# Patient Record
Sex: Female | Born: 2000 | Race: Black or African American | Hispanic: Yes | Marital: Single | Smoking: Never smoker
Health system: Southern US, Community
[De-identification: ages and names within clinical notes are randomized; demographics above are authoritative.]

## PROBLEM LIST (undated history)

## (undated) DIAGNOSIS — I341 Nonrheumatic mitral (valve) prolapse: Secondary | ICD-10-CM

## (undated) DIAGNOSIS — L309 Dermatitis, unspecified: Secondary | ICD-10-CM

## (undated) DIAGNOSIS — F79 Unspecified intellectual disabilities: Secondary | ICD-10-CM

## (undated) DIAGNOSIS — R634 Abnormal weight loss: Secondary | ICD-10-CM

## (undated) DIAGNOSIS — J45909 Unspecified asthma, uncomplicated: Secondary | ICD-10-CM

## (undated) DIAGNOSIS — N946 Dysmenorrhea, unspecified: Secondary | ICD-10-CM

## (undated) DIAGNOSIS — J069 Acute upper respiratory infection, unspecified: Secondary | ICD-10-CM

## (undated) HISTORY — DX: Unspecified asthma, uncomplicated: J45.909

## (undated) HISTORY — DX: Dysmenorrhea, unspecified: N94.6

## (undated) HISTORY — PX: TOENAIL EXCISION: SHX183

## (undated) HISTORY — PX: MOUTH SURGERY: SHX715

## (undated) HISTORY — DX: Abnormal weight loss: R63.4

## (undated) HISTORY — DX: Dermatitis, unspecified: L30.9

## (undated) HISTORY — DX: Acute upper respiratory infection, unspecified: J06.9

## (undated) HISTORY — PX: TEAR DUCT PROBING: SHX793

---
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
## 2020-02-09 ENCOUNTER — Ambulatory Visit: Payer: Medicaid Other | Attending: Internal Medicine

## 2020-02-09 DIAGNOSIS — Z23 Encounter for immunization: Secondary | ICD-10-CM

## 2020-02-09 NOTE — Progress Notes (Signed)
   Covid-19 Vaccination Clinic  Name:  Lauren Cain    MRN: [id number] DOB: [birth year]  02/09/2020  [name] was observed post Covid-19 immunization for 15 minutes without incident. She was provided with Vaccine Information Sheet and instruction to access the V-Safe system.   [name] was instructed to call 911 with any severe reactions post vaccine: Marland Kitchen Difficulty breathing  . Swelling of face and throat  . A fast heartbeat  . A bad rash all over body  . Dizziness and weakness   Immunizations Administered    Name Date Dose VIS Date Route   Pfizer COVID-19 Vaccine 02/09/2020  1:44 PM 0.3 mL 12/03/2019 Intramuscular   Manufacturer: [employer], [employer]   Lot: [id number]   NDC: [address]-4

## 2020-02-11 ENCOUNTER — Other Ambulatory Visit: Payer: Self-pay

## 2020-02-11 ENCOUNTER — Ambulatory Visit: Payer: Medicaid Other | Admitting: Allergy

## 2020-02-11 ENCOUNTER — Ambulatory Visit
Admission: EM | Admit: 2020-02-11 | Discharge: 2020-02-11 | Disposition: A | Discharge status: Home / Self Care | Payer: Medicaid Other | Attending: Internal Medicine | Admitting: Internal Medicine

## 2020-02-11 DIAGNOSIS — R6889 Other general symptoms and signs: Secondary | ICD-10-CM | POA: Diagnosis not present

## 2020-02-11 DIAGNOSIS — M940 Chondrocostal junction syndrome [Tietze]: Secondary | ICD-10-CM

## 2020-02-11 DIAGNOSIS — Z1152 Encounter for screening for COVID-19: Secondary | ICD-10-CM

## 2020-02-11 MED ORDER — OSELTAMIVIR PHOSPHATE 75 MG PO CAPS: 75.0000 mg | ORAL_CAPSULE | Freq: Two times a day (BID) | ORAL | 0 refills | Status: DC
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
## 2020-02-11 NOTE — Discharge Instructions (Addendum)
Due to your chronic conditions and if your covid test is positive, you may qualify to receive Monoclonal Antibody infusion and if you don't hear from anyone within 24 hours of finding your results, call [phone]  I am placing you on the Tamiflu to cover influenza since will take 2-3 days for the test to come back and is best to start within 48h of symptoms

## 2020-02-11 NOTE — ED Triage Notes (Addendum)
Pt is here with chills, congestion with a cough that started yesterday, pt has taken OTC meds to relieve discomfort.

## 2020-02-12 LAB — COVID-19, FLU A+B NAA
Influenza A, NAA: NOT DETECTED
Influenza B, NAA: NOT DETECTED
SARS-CoV-2, NAA: DETECTED — AB

## 2020-02-19 ENCOUNTER — Ambulatory Visit: Payer: Medicaid Other

## 2020-02-24 ENCOUNTER — Ambulatory Visit (INDEPENDENT_AMBULATORY_CARE_PROVIDER_SITE_OTHER): Payer: Medicaid Other

## 2020-02-24 ENCOUNTER — Other Ambulatory Visit: Payer: Self-pay

## 2020-02-24 DIAGNOSIS — Z3042 Encounter for surveillance of injectable contraceptive: Secondary | ICD-10-CM

## 2020-02-24 MED ORDER — MEDROXYPROGESTERONE ACETATE 150 MG/ML IM SUSP
150.0000 mg | Freq: Once | INTRAMUSCULAR | Status: AC
  Administered 2020-02-24: 150 mg via INTRAMUSCULAR

## 2020-02-24 NOTE — Progress Notes (Signed)
Pt presents for depo injection. Pt within depo window, no urine hcg needed. Injection given, tolerated well. F/u depo injection visit scheduled.   

## 2020-02-28 ENCOUNTER — Other Ambulatory Visit: Payer: Self-pay | Admitting: Allergy

## 2020-02-28 DIAGNOSIS — J4520 Mild intermittent asthma, uncomplicated: Secondary | ICD-10-CM

## 2020-03-09 ENCOUNTER — Telehealth: Payer: Self-pay | Admitting: Pediatrics

## 2020-03-15 NOTE — Telephone Encounter (Signed)
I spoke to her mother Burna Mortimer to inform her that the appliances for the nebulizer machine will be sent in. I also notified her to call [employer] to request the proper tubing she would need for her nebulizer.

## 2020-03-19 ENCOUNTER — Ambulatory Visit: Payer: Medicaid Other | Admitting: Allergy

## 2020-04-19 ENCOUNTER — Other Ambulatory Visit: Payer: Self-pay

## 2020-04-19 ENCOUNTER — Ambulatory Visit
Admission: EM | Admit: 2020-04-19 | Discharge: 2020-04-19 | Disposition: A | Discharge status: Home / Self Care | Payer: Medicaid Other | Attending: Family Medicine | Admitting: Family Medicine

## 2020-04-19 DIAGNOSIS — L0501 Pilonidal cyst with abscess: Secondary | ICD-10-CM | POA: Diagnosis not present

## 2020-04-19 DIAGNOSIS — L03317 Cellulitis of buttock: Secondary | ICD-10-CM

## 2020-04-19 MED ORDER — DOXYCYCLINE HYCLATE 100 MG PO CAPS: 100.0000 mg | ORAL_CAPSULE | Freq: Two times a day (BID) | ORAL | 0 refills | Status: DC

## 2020-04-19 NOTE — ED Triage Notes (Signed)
Pt presents with possible abscess on her bottom, area is draining purulent drainage. Patient complains of pain in the area x 3 days.
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
## 2020-04-19 NOTE — Discharge Instructions (Addendum)
Monitor for signs of infection.  Start doxycycline 100 mg twice daily for the next 10 days.  Change dressing at thinly twice daily while area is draining.  Once draining stops discontinue dressings.  You can use the sanitary pad as your dressing.  Wash and regular soap and water but avoid taking baths and showers until area completely heals.

## 2020-04-23 ENCOUNTER — Ambulatory Visit: Payer: Medicaid Other | Admitting: Pediatrics

## 2020-05-05 ENCOUNTER — Encounter: Payer: Self-pay | Admitting: Allergy

## 2020-05-05 ENCOUNTER — Other Ambulatory Visit: Payer: Self-pay

## 2020-05-05 ENCOUNTER — Ambulatory Visit (INDEPENDENT_AMBULATORY_CARE_PROVIDER_SITE_OTHER): Payer: Medicaid Other | Admitting: Allergy

## 2020-05-05 VITALS — BP 110/72 | HR 105 | Temp 97.6°F | Resp 18

## 2020-05-05 DIAGNOSIS — R040 Epistaxis: Secondary | ICD-10-CM

## 2020-05-05 DIAGNOSIS — J4520 Mild intermittent asthma, uncomplicated: Secondary | ICD-10-CM | POA: Diagnosis not present

## 2020-05-05 DIAGNOSIS — T7800XD Anaphylactic reaction due to unspecified food, subsequent encounter: Secondary | ICD-10-CM

## 2020-05-05 DIAGNOSIS — H1013 Acute atopic conjunctivitis, bilateral: Secondary | ICD-10-CM | POA: Diagnosis not present

## 2020-05-05 DIAGNOSIS — J3089 Other allergic rhinitis: Secondary | ICD-10-CM

## 2020-05-05 MED ORDER — IPRATROPIUM BROMIDE 0.06 % NA SOLN: NASAL | 5 refills | Status: DC

## 2020-05-05 MED ORDER — ALBUTEROL SULFATE HFA 108 (90 BASE) MCG/ACT IN AERS: INHALATION_SPRAY | RESPIRATORY_TRACT | 2 refills | Status: DC

## 2020-05-05 NOTE — Patient Instructions (Addendum)
Asthma - Continue Budesonide 0.5mg  twice a day mixed with Formoterol nebulized.  Mix Budesonide vial with Formoterol vial in nebulizer twice a day.   Do not use Formoterol alone.   - Use albuterol rescue inhaler or nebulizer as needed, up to 2 puffs every 4-6 hours during an asthma exacerbation.  - Recommend using albuterol prior to activity/exercise.  Use the albuterol pump inhaler with spacer.  Use 2 puffs 15-20 minutes prior to exercise/activity  - Recommend you talk with the teacher to advise to have Sher'mia use albuterol prior to PE class or physical activities - Let us know if you are not meeting the following goals.   Asthma control goals:   Full participation in all desired activities (may need albuterol before activity)  Albuterol use two time or less a week on average (not counting use with activity)  Cough interfering with sleep two time or less a month  Oral steroids no more than once a year  No hospitalizations  Allergic rhinitis - Continue Xyzal 5mg  daily during the week.  If needed can take extra dose in the AM.   - Stop Nasonex due to nosebleeds - Start nasal Atrovent 1-2 sprays each nostril 1-2 times a day for nasal congestion or drainage - Use nasal saline spray daily to help decrease risk of nosebleeds - Continue montelukast 10 mg daily - Use Pataday 1 drop each eye daily as needed for itchy/watery eyes. - Continue to use dust covers for dust mite control  Food allergies  - Continue to avoid all shellfish - Have access to your EpiPen at all times, and follow your emergency action plan.   Follow up in 4-6 months or sooner if needed

## 2020-05-05 NOTE — Progress Notes (Signed)
Follow-up Note  RE: Lauren Cain MRN: [id number] DOB: 07-Dec-2000 Date of Office Visit: 05/05/2020   History of present illness: Lauren Cain is a 20 y.o. female presenting today for follow-up of asthma, allergic rhinitis and food allergy.  She was last seen in the office on 11/19/19 by myself.  She presents today with her mother.  At her last visit she had an asthma exacerbation that I recommended she take an extended prednisone course to help with this. Her flare up did improve.  She only notes now that she gets short of breath with exercise during PE like running the track.  She does not use albuterol prior to activity.  Mother states she should be able to use albuterol prior to exercise and her inhaler is kept with the teacher that she has all day.  She continues to use budesonide and formoterol nebulized twice a day.   She has noted more nasal congestion and did have a nose bleed about 3 weeks ago.   She states the nose bleed took about 5 minutes to stop after applying pressure.  No triggering events to the nosebleed that she is aware of.  She has been using nasonex daily.  She is also not sure if Patanase is helpful for drainage control.  She does still take xyzal daily.  She has pataday but is not reporting any eye symptoms.   She continues to avoid shellfish without any accidental ingestions.  Mother states she has never had any symptoms when she has eaten at restaurants that cook shellfish.  She has access to an utd epinephrine device.   Review of systems: Review of Systems  Constitutional: Negative.   HENT:       See HPI  Eyes: Negative.   Respiratory:       See HPI  Cardiovascular: Negative.   Gastrointestinal: Negative.   Musculoskeletal: Negative.   Skin: Negative.   Neurological: Negative.     All other systems negative unless noted above in HPI  Past medical/social/surgical/family history have been reviewed and are unchanged unless specifically indicated below.  No  changes  Medication List: Current Outpatient Medications  Medication Sig Dispense Refill  . albuterol (PROVENTIL) (2.5 MG/3ML) 0.083% nebulizer solution USE 1 VIAL IN NEBULIZER EVERY 4 HOURS AS NEEDED FOR WHEEZING/SHORTNESS OF BREATH 75 mL 3  . budesonide (PULMICORT) 0.5 MG/2ML nebulizer solution Take 2 mLs (0.5 mg total) by nebulization 2 (two) times daily. 75 mL 5  . diphenhydrAMINE (BENADRYL) 12.5 MG/5ML elixir TAKE ONE TIME AS NEEDED FOR UP TO 1 DOSE FOR ALLERGIES CAN GIVE 2ND DOSE AFTER 15 MINUTES)    . doxycycline (VIBRAMYCIN) 100 MG capsule Take 1 capsule (100 mg total) by mouth 2 (two) times daily. 20 capsule 0  . EPINEPHrine 0.3 mg/0.3 mL IJ SOAJ injection Inject 0.3 mLs (0.3 mg total) into the muscle as needed for anaphylaxis. 2 each 1  . fluticasone (FLONASE) 50 MCG/ACT nasal spray Place 1 spray into both nostrils daily. 16 g 0  . formoterol (PERFOROMIST) 20 MCG/2ML nebulizer solution Take 2 mLs (20 mcg total) by nebulization 2 (two) times daily. Mix with Budesonide in vial 120 mL 5  . ipratropium (ATROVENT) 0.06 % nasal spray 1-2 Sprays each nostril 1-2 times a day for nasal congestion or drainage 15 mL 5  . levocetirizine (XYZAL) 5 MG tablet Take 1 tablet (5 mg total) by mouth every evening. 30 tablet 5  . medroxyPROGESTERone (DEPO-PROVERA) 150 MG/ML injection Inject 150 mg into the  muscle every 3 (three) months.    . mometasone (NASONEX) 50 MCG/ACT nasal spray Place 2 sprays into the nose daily. 17 g 5  . montelukast (SINGULAIR) 10 MG tablet TAKE 1 TABLET BY MOUTH EVERYDAY AT BEDTIME 30 tablet 5  . NON FORMULARY [facility]  Antifungal Nail - Terbinafine 3$; Fluconazole 2% Teat Tree Oil; 5% Urea; 10% Ibuprofen; 2% in DMSO Suspension #27mL Apply to the affected nail(s) once (at bedtime) or twice daily  Refills-as needed  Faxed over to [place] on 01/21/19    . polyethylene glycol (MIRALAX / GLYCOLAX) 17 g packet Take 17 g by mouth daily. 14 each 3  .  risperiDONE (RISPERDAL) 1 MG tablet TAKE 1 TABLET EVERY DAY 31 tablet 2  . Spacer/Aero Chamber [employer] Use as directed. 1 each 0  . albuterol (PROAIR HFA) 108 (90 Base) MCG/ACT inhaler INHALE 2 PUFFS INTO LUNGS EVERY 4 HOURS AS NEEDED FOR COUGH OR WHEEZE 2 each 2   No current facility-administered medications for this visit.     Known medication allergies: Allergies  Allergen Reactions  . Shellfish-Derived Products Swelling  . Cats Claw [Uncaria Tomentosa (Cats Claw)]   . Dust Mite Extract   . Other     TREES  . Shellfish Allergy   . Tree Extract      Physical examination: Blood pressure 110/72, pulse (!) 105, temperature 97.6 F (36.4 C), temperature source Temporal, resp. rate 18, SpO2 96 %.  General: Alert, interactive, in no acute distress. HEENT: PERRLA, TMs pearly gray, turbinates mildly edematous without discharge, post-pharynx non erythematous. Neck: Supple without lymphadenopathy. Lungs: Clear to auscultation without wheezing, rhonchi or rales. {no increased work of breathing. CV: Normal S1, S2 without murmurs. Abdomen: Nondistended, nontender. Skin: Warm and dry, without lesions or rashes. Extremities:  No clubbing, cyanosis or edema. Neuro:   Grossly intact.  Diagnositics/Labs: None today  Assessment and plan:   Asthma, mild intermittent - Continue Budesonide 0.5mg  twice a day mixed with Formoterol nebulized.  Mix Budesonide vial with Formoterol vial in nebulizer twice a day.   Do not use Formoterol alone.   - Use albuterol rescue inhaler or nebulizer as needed, up to 2 puffs every 4-6 hours during an asthma exacerbation.  - Recommend using albuterol prior to activity/exercise.  Use the albuterol pump inhaler with spacer.  Use 2 puffs 15-20 minutes prior to exercise/activity  - Recommend you talk with the teacher to advise to have Sher'mia use albuterol prior to PE class or physical activities - Let us know if you are not meeting the following goals.    Asthma control goals:   Full participation in all desired activities (may need albuterol before activity)  Albuterol use two time or less a week on average (not counting use with activity)  Cough interfering with sleep two time or less a month  Oral steroids no more than once a year  No hospitalizations  Allergic rhinitis - Continue Xyzal 5mg  daily during the week.  If needed can take extra dose in the AM.   - Stop Nasonex due to nosebleeds - Start nasal Atrovent 1-2 sprays each nostril 1-2 times a day for nasal congestion or drainage - Use nasal saline spray daily to help decrease risk of nosebleeds - Continue montelukast 10 mg daily - Use Pataday 1 drop each eye daily as needed for itchy/watery eyes. - Continue to use dust covers for dust mite control  Anaphylaxis due to food  - Continue to avoid all shellfish -  Have access to your EpiPen at all times, and follow your emergency action plan.   Follow up in 4-6 months or sooner if needed I appreciate the opportunity to take part in Eyvonne's care. Please do not hesitate to contact me with questions.  Sincerely,   Margo Aye, MD Allergy/Immunology [facility]

## 2020-05-11 ENCOUNTER — Other Ambulatory Visit: Payer: Self-pay

## 2020-05-11 ENCOUNTER — Ambulatory Visit (INDEPENDENT_AMBULATORY_CARE_PROVIDER_SITE_OTHER): Payer: Medicaid Other

## 2020-05-11 DIAGNOSIS — Z3042 Encounter for surveillance of injectable contraceptive: Secondary | ICD-10-CM | POA: Diagnosis not present

## 2020-05-11 MED ORDER — MEDROXYPROGESTERONE ACETATE 150 MG/ML IM SUSP
150.0000 mg | Freq: Once | INTRAMUSCULAR | Status: AC
  Administered 2020-05-11: 150 mg via INTRAMUSCULAR

## 2020-05-11 NOTE — Progress Notes (Signed)
Pt presents for depo injection. Pt within depo window, no urine hcg needed. Injection given, tolerated well. F/u depo injection visit scheduled.   

## 2020-05-20 ENCOUNTER — Other Ambulatory Visit: Payer: Self-pay

## 2020-05-20 ENCOUNTER — Ambulatory Visit
Admission: EM | Admit: 2020-05-20 | Discharge: 2020-05-20 | Disposition: A | Discharge status: Home / Self Care | Payer: Medicaid Other | Attending: Physician Assistant | Admitting: Physician Assistant

## 2020-05-20 DIAGNOSIS — R0981 Nasal congestion: Secondary | ICD-10-CM

## 2020-05-20 DIAGNOSIS — J069 Acute upper respiratory infection, unspecified: Secondary | ICD-10-CM

## 2020-05-20 NOTE — Discharge Instructions (Signed)
Continue prescribed medication. You can use Mucinex for additional symptom relief. We will be in touch with your testing results. If anything worsens you need to be seen immediately.

## 2020-05-20 NOTE — ED Triage Notes (Signed)
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 Mom states pt has allergies.
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
## 2020-05-22 LAB — COVID-19, FLU A+B NAA
Influenza A, NAA: NOT DETECTED
Influenza B, NAA: NOT DETECTED
SARS-CoV-2, NAA: NOT DETECTED

## 2020-05-27 ENCOUNTER — Encounter: Payer: Self-pay | Admitting: Developmental - Behavioral Pediatrics

## 2020-07-15 ENCOUNTER — Ambulatory Visit: Payer: Medicaid Other | Admitting: Sports Medicine

## 2020-07-27 ENCOUNTER — Other Ambulatory Visit (HOSPITAL_COMMUNITY)
Admission: RE | Admit: 2020-07-27 | Discharge: 2020-07-27 | Disposition: A | Discharge status: Home / Self Care | Payer: Medicaid Other | Source: Ambulatory Visit | Attending: Pediatrics | Admitting: Pediatrics

## 2020-07-27 ENCOUNTER — Encounter: Payer: Self-pay | Admitting: Pediatrics

## 2020-07-27 ENCOUNTER — Other Ambulatory Visit: Payer: Self-pay

## 2020-07-27 ENCOUNTER — Ambulatory Visit (INDEPENDENT_AMBULATORY_CARE_PROVIDER_SITE_OTHER): Payer: Medicaid Other | Admitting: Pediatrics

## 2020-07-27 ENCOUNTER — Ambulatory Visit: Payer: Medicaid Other

## 2020-07-27 VITALS — BP 104/66 | HR 95 | Ht 61.5 in | Wt 136.6 lb

## 2020-07-27 DIAGNOSIS — Z114 Encounter for screening for human immunodeficiency virus [HIV]: Secondary | ICD-10-CM

## 2020-07-27 DIAGNOSIS — Z23 Encounter for immunization: Secondary | ICD-10-CM | POA: Diagnosis not present

## 2020-07-27 DIAGNOSIS — Z3042 Encounter for surveillance of injectable contraceptive: Secondary | ICD-10-CM

## 2020-07-27 DIAGNOSIS — R32 Unspecified urinary incontinence: Secondary | ICD-10-CM | POA: Diagnosis not present

## 2020-07-27 DIAGNOSIS — Z113 Encounter for screening for infections with a predominantly sexual mode of transmission: Secondary | ICD-10-CM

## 2020-07-27 DIAGNOSIS — Z68.41 Body mass index (BMI) pediatric, 5th percentile to less than 85th percentile for age: Secondary | ICD-10-CM

## 2020-07-27 DIAGNOSIS — Z0001 Encounter for general adult medical examination with abnormal findings: Secondary | ICD-10-CM | POA: Diagnosis not present

## 2020-07-27 DIAGNOSIS — R94120 Abnormal auditory function study: Secondary | ICD-10-CM

## 2020-07-27 DIAGNOSIS — R234 Changes in skin texture: Secondary | ICD-10-CM

## 2020-07-27 LAB — POCT URINALYSIS DIPSTICK
Bilirubin, UA: NEGATIVE
Glucose, UA: NEGATIVE
Ketones, UA: NEGATIVE
Nitrite, UA: NEGATIVE
Protein, UA: NEGATIVE
Spec Grav, UA: 1.015 (ref 1.010–1.025)
Urobilinogen, UA: 0.2 E.U./dL
pH, UA: 7 (ref 5.0–8.0)

## 2020-07-27 LAB — POCT RAPID HIV: Rapid HIV, POC: NEGATIVE

## 2020-07-27 MED ORDER — MEDROXYPROGESTERONE ACETATE 150 MG/ML IM SUSP
150.0000 mg | Freq: Once | INTRAMUSCULAR | Status: AC
  Administered 2020-07-27: 150 mg via INTRAMUSCULAR

## 2020-07-27 MED ORDER — CLOTRIMAZOLE 1 % EX CREA: 1.0000 "application " | TOPICAL_CREAM | Freq: Two times a day (BID) | CUTANEOUS | 0 refills | Status: DC

## 2020-07-27 NOTE — Progress Notes (Signed)
Adolescent Well Care Visit Lauren Cain is a 20 y.o. female who is here for well care.    PCP:  Ancil Linsey, MD   History was provided by the patient and mother.  Confidentiality was discussed with the patient and, if applicable, with caregiver as well. Patient's personal or confidential phone number:    Current Issues: Current concerns include   Rash - left foot; tried tee oil; spreading up foot none itch   Incontinence- forgets that she has to use rest room; has accidents on herself; 2 x per week for about a month.  But less previously; denies fevers dysuria or abdominal pain.  No history of UTI.   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  Mom concerned that she is not capable of being left alone in hospital.  Has applied for disibility for her but did not go through with it due to receipt of only $400 per month.  Currently still under care of Neuropsychiatric with Dr Lauren Cain - no changes to medicines.  Mitral valve prolapse- followed by cardiology   Nutrition: Nutrition/Eating Behaviors: Well balanced diet with fruits vegetables and meats. Adequate calcium in diet?: yes  Supplements/ Vitamins: noen   Exercise/ Media: Play any Sports?/ Exercise: none   Sleep:  Sleep: sleeps well throughout the night   Social Screening: Lives with:  Mother and 3 siblings  Parental relations:  good Activities, Work, and Chores?: yes some at home.  Concerns regarding behavior with peers?  no Stressors of note: no  Education: School Name: Graduated from [employer]    Menstruation:   No LMP recorded. Patient has had an injection. Menstrual History: receives Depo and due for injection today    Confidential Social History: Tobacco?  no Secondhand smoke exposure?  no Drugs/ETOH?  no  Sexually Active?  no   Pregnancy Prevention: n/a  Safe at home, in school & in relationships?  Yes Safe to self?  Yes   Screenings: Patient has a dental home: yes  The  patient completed the Rapid Assessment for Adolescent Preventive Services screening questionnaire and the following topics were identified as risk factors and discussed: unable to complete  In addition, the following topics were discussed as part of anticipatory guidance healthy eating, exercise, and birth control.  PHQ-9 completed and results indicated not completed   Physical Exam:  Vitals:   07/27/20 0916  BP: 104/66  Pulse: 95  SpO2: 99%  Weight: 136 lb 9.6 oz (62 kg)  Height: 5' 1.5" (1.562 m)   BP 104/66 (BP Location: Right Arm, Patient Position: Sitting, Cuff Size: Normal)   Pulse 95   Ht 5' 1.5" (1.562 m)   Wt 136 lb 9.6 oz (62 kg)   SpO2 99%   BMI 25.39 kg/m  Body mass index: body mass index is 25.39 kg/m. Blood pressure percentiles are not available for patients who are 18 years or older.  Hearing Screening  Method: Audiometry   500Hz  1000Hz  2000Hz  4000Hz   Right ear Fail Fail 20 Fail  Left ear 40 40 25 25   Vision Screening   Right eye Left eye Both eyes  Without correction 20/40 20/25   With correction       General Appearance:   alert, oriented, no acute distress and well nourished  HENT: Normocephalic, no obvious abnormality, conjunctiva clear  Mouth:   Normal appearing teeth, no obvious discoloration, dental caries, or dental caps  Neck:   Supple; thyroid: no enlargement, symmetric,  no tenderness/mass/nodules  Chest No anterior chest wall abnormality   Lungs:   Clear to auscultation bilaterally, normal work of breathing  Heart:   Regular rate and rhythm, S1 and S2 normal, no murmurs;   Abdomen:   Soft, non-tender, no mass, or organomegaly  GU genitalia not examined  Musculoskeletal:   Tone and strength strong and symmetrical, all extremities               Lymphatic:   No cervical adenopathy  Skin/Hair/Nails:   Peeling of left foot   Neurologic:   Strength, gait, and coordination normal and age-appropriate     Assessment and Plan:   Lauren Cain is a 20  yo F here for adult exam. Has known intellectual disability with concern for incontinence today.  Prescription to home health supply for incontinence supplies today.  Will need to transition to adult care and referral placed today as well.   BMI is appropriate for age  Hearing screening result: uncooperative  Vision screening result:  uncooperative   Counseling provided for all of the vaccine components  Orders Placed This Encounter  Procedures   Urine Culture   Amb[facility] to [facility]   POCT Rapid HIV   POCT urinalysis dipstick   1. Routine screening for STI (sexually transmitted infection)  - POCT Rapid HIV - Urine cytology ancillary only  2. Encounter for general adult medical examination with abnormal findings  - Ambulatory referral to [facility]  3. Body mass index, pediatric, 5th percentile to less than 85th percentile for age   71. Need for vaccination   5. Failed hearing screening   6. Peeling skin Will treat for presumed tinea pedis  - clotrimazole (LOTRIMIN) 1 % cream; Apply 1 application topically 2 (two) times daily. To foot  Dispense: 30 g; Refill: 0  7. Urinary incontinence, unspecified type  - POCT urinalysis dipstick - Urine Culture  8. Encounter for Depo-Provera contraception  - medroxyPROGESTERone (DEPO-PROVERA) injection 150 mg  Return in 1 year (on 07/27/2021).Marland Kitchen  Ancil Linsey, MD

## 2020-07-28 ENCOUNTER — Telehealth: Payer: Self-pay

## 2020-07-28 LAB — URINE CYTOLOGY ANCILLARY ONLY
Chlamydia: NEGATIVE
Comment: NEGATIVE
Comment: NORMAL
Neisseria Gonorrhea: NEGATIVE

## 2020-07-28 LAB — URINE CULTURE
MICRO NUMBER:: 12006306
SPECIMEN QUALITY:: ADEQUATE

## 2020-07-28 NOTE — Telephone Encounter (Signed)
Faxed prescription for incontinence supplies along with demographics and supporting office notes (from visit 6/14 with Dr. Kennedy Bucker) to [facility] at:  [phone] to Attn: Oleta Mouse.  Annice Pih states she will reach out to family and offer samples of diapers to choose from as well as provide wipes, gloves and underpads if family would like them.

## 2020-08-22 ENCOUNTER — Other Ambulatory Visit: Payer: Self-pay | Admitting: Allergy

## 2020-08-22 DIAGNOSIS — J3089 Other allergic rhinitis: Secondary | ICD-10-CM

## 2020-09-24 ENCOUNTER — Other Ambulatory Visit: Payer: Self-pay | Admitting: Allergy

## 2020-09-24 DIAGNOSIS — J3089 Other allergic rhinitis: Secondary | ICD-10-CM

## 2020-09-24 NOTE — Telephone Encounter (Signed)
30 day courtesy refill was last sent 08/22/20. Patient needs an appointment for further refills.

## 2020-11-02 ENCOUNTER — Telehealth: Payer: Self-pay

## 2020-11-02 NOTE — Telephone Encounter (Signed)
Patient called needing a follow up and nebulizer tubing and mask sent in.  I told her to call [employer] and have them fax over any paperwork needed for the kit to be covered by their insurance. Mom plans to call the number on the nebulizer back and give them our fax number: [phone].

## 2020-11-03 ENCOUNTER — Other Ambulatory Visit: Payer: Self-pay | Admitting: Allergy

## 2020-11-03 DIAGNOSIS — J4520 Mild intermittent asthma, uncomplicated: Secondary | ICD-10-CM

## 2020-11-03 DIAGNOSIS — J3089 Other allergic rhinitis: Secondary | ICD-10-CM

## 2020-11-04 ENCOUNTER — Ambulatory Visit (INDEPENDENT_AMBULATORY_CARE_PROVIDER_SITE_OTHER): Payer: Medicaid Other

## 2020-11-04 ENCOUNTER — Other Ambulatory Visit: Payer: Self-pay

## 2020-11-04 DIAGNOSIS — Z3042 Encounter for surveillance of injectable contraceptive: Secondary | ICD-10-CM

## 2020-11-04 MED ORDER — MEDROXYPROGESTERONE ACETATE 150 MG/ML IM SUSP
150.0000 mg | Freq: Once | INTRAMUSCULAR | Status: AC
  Administered 2020-11-04: 150 mg via INTRAMUSCULAR

## 2020-11-04 NOTE — Progress Notes (Signed)
Pt presents for depo injection. Pt within depo window, no urine hcg needed. Injection given, tolerated well. F/u depo injection visit scheduled.   

## 2020-11-05 ENCOUNTER — Ambulatory Visit: Payer: Medicaid Other | Admitting: Allergy

## 2020-12-09 ENCOUNTER — Other Ambulatory Visit: Payer: Self-pay | Admitting: Allergy

## 2020-12-09 DIAGNOSIS — J3089 Other allergic rhinitis: Secondary | ICD-10-CM

## 2020-12-11 NOTE — Patient Instructions (Incomplete)
Asthma - Continue Budesonide 0.5mg  twice a day mixed with Formoterol nebulized.  Mix Budesonide vial with Formoterol vial in nebulizer twice a day.   Do not use Formoterol alone.   - Use albuterol rescue inhaler or nebulizer as needed, up to 2 puffs every 4-6 hours during an asthma exacerbation.  - Recommend using albuterol prior to activity/exercise.  Use the albuterol pump inhaler with spacer.  Use 2 puffs 15-20 minutes prior to exercise/activity  - Let us know if you are not meeting the following goals.   Asthma control goals:  Full participation in all desired activities (may need albuterol before activity) Albuterol use two time or less a week on average (not counting use with activity) Cough interfering with sleep two time or less a month Oral steroids no more than once a year No hospitalizations  Allergic rhinitis(Nasonex caused nose bleeds - Continue Xyzal 5mg  daily during the week.  If needed can take extra dose in the AM.   - Continuenasal Atrovent 1-2 sprays each nostril 1-2 times a day for nasal congestion or drainage - Use nasal saline spray daily to help decrease risk of nosebleeds - Continue montelukast 10 mg daily - Use Pataday 1 drop each eye daily as needed for itchy/watery eyes. - Continue to use dust covers for dust mite control  Food allergies  - Continue to avoid all shellfish. In case of an allergic reaction, give Benadryl *** {Blank single:19197::"teaspoonful","teaspoonfuls","capsules"} every {blank single:19197::"4","6"} hours, and if life-threatening symptoms occur, inject with EpiPen 0.3 mg.  Follow up in 4-6 months or sooner if needed

## 2020-12-13 ENCOUNTER — Ambulatory Visit: Payer: Medicaid Other | Admitting: Family

## 2020-12-22 ENCOUNTER — Telehealth: Payer: Self-pay | Admitting: *Deleted

## 2020-12-24 ENCOUNTER — Ambulatory Visit (INDEPENDENT_AMBULATORY_CARE_PROVIDER_SITE_OTHER): Payer: Medicaid Other | Admitting: Sports Medicine

## 2020-12-24 ENCOUNTER — Encounter: Payer: Self-pay | Admitting: Sports Medicine

## 2020-12-24 DIAGNOSIS — M79671 Pain in right foot: Secondary | ICD-10-CM

## 2020-12-24 DIAGNOSIS — Q666 Other congenital valgus deformities of feet: Secondary | ICD-10-CM

## 2020-12-24 DIAGNOSIS — M79672 Pain in left foot: Secondary | ICD-10-CM
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
## 2021-01-04 NOTE — Telephone Encounter (Signed)
Error message

## 2021-01-20 ENCOUNTER — Encounter: Payer: Self-pay | Admitting: Pediatrics

## 2021-01-20 ENCOUNTER — Ambulatory Visit: Payer: Medicaid Other

## 2021-01-24 ENCOUNTER — Emergency Department (HOSPITAL_BASED_OUTPATIENT_CLINIC_OR_DEPARTMENT_OTHER)
Admission: EM | Admit: 2021-01-24 | Discharge: 2021-01-24 | Disposition: A | Discharge status: Home / Self Care | Payer: Medicaid Other | Attending: Emergency Medicine | Admitting: Emergency Medicine

## 2021-01-24 ENCOUNTER — Other Ambulatory Visit: Payer: Self-pay

## 2021-01-24 ENCOUNTER — Encounter (HOSPITAL_BASED_OUTPATIENT_CLINIC_OR_DEPARTMENT_OTHER): Payer: Self-pay | Admitting: Emergency Medicine

## 2021-01-24 DIAGNOSIS — R059 Cough, unspecified: Secondary | ICD-10-CM | POA: Diagnosis present

## 2021-01-24 DIAGNOSIS — J069 Acute upper respiratory infection, unspecified: Secondary | ICD-10-CM | POA: Insufficient documentation

## 2021-01-24 DIAGNOSIS — Z20822 Contact with and (suspected) exposure to covid-19: Secondary | ICD-10-CM | POA: Insufficient documentation

## 2021-01-24 DIAGNOSIS — J4520 Mild intermittent asthma, uncomplicated: Secondary | ICD-10-CM | POA: Diagnosis not present

## 2021-01-24 LAB — RESP PANEL BY RT-PCR (FLU A&B, COVID) ARPGX2
Influenza A by PCR: NEGATIVE
Influenza B by PCR: NEGATIVE
SARS Coronavirus 2 by RT PCR: NEGATIVE

## 2021-01-24 NOTE — ED Notes (Signed)
Provided juice and snacks, VSS, NAD at this time.

## 2021-01-24 NOTE — ED Triage Notes (Signed)
Pt arrives to ED with c/o congestion, cough, chills x7 days.
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
## 2021-01-28 ENCOUNTER — Ambulatory Visit (INDEPENDENT_AMBULATORY_CARE_PROVIDER_SITE_OTHER): Payer: Medicaid Other | Admitting: Allergy

## 2021-01-28 ENCOUNTER — Encounter: Payer: Self-pay | Admitting: Allergy

## 2021-01-28 ENCOUNTER — Other Ambulatory Visit: Payer: Self-pay

## 2021-01-28 DIAGNOSIS — T7800XD Anaphylactic reaction due to unspecified food, subsequent encounter: Secondary | ICD-10-CM

## 2021-01-28 DIAGNOSIS — J4520 Mild intermittent asthma, uncomplicated: Secondary | ICD-10-CM

## 2021-01-28 DIAGNOSIS — J3089 Other allergic rhinitis: Secondary | ICD-10-CM | POA: Diagnosis not present

## 2021-01-28 MED ORDER — IPRATROPIUM BROMIDE 0.06 % NA SOLN: NASAL | 5 refills | Status: DC

## 2021-01-28 MED ORDER — MONTELUKAST SODIUM 10 MG PO TABS: 10.0000 mg | ORAL_TABLET | Freq: Every day | ORAL | 5 refills | Status: DC

## 2021-01-28 MED ORDER — BUDESONIDE 0.5 MG/2ML IN SUSP
0.5000 mg | Freq: Two times a day (BID) | RESPIRATORY_TRACT | 3 refills | Status: DC
Start: 2021-01-28 — End: 2021-06-15

## 2021-01-28 MED ORDER — OLOPATADINE HCL 0.2 % OP SOLN: OPHTHALMIC | 5 refills | Status: DC

## 2021-01-28 MED ORDER — LEVOCETIRIZINE DIHYDROCHLORIDE 5 MG PO TABS
5.0000 mg | ORAL_TABLET | Freq: Every evening | ORAL | 5 refills | Status: DC
Start: 2021-01-28 — End: 2021-06-15

## 2021-01-28 MED ORDER — EPINEPHRINE 0.3 MG/0.3ML IJ SOAJ: 0.3000 mg | INTRAMUSCULAR | 1 refills | Status: DC | PRN

## 2021-01-28 MED ORDER — ALBUTEROL SULFATE (2.5 MG/3ML) 0.083% IN NEBU
INHALATION_SOLUTION | RESPIRATORY_TRACT | 3 refills | Status: DC
Start: 2021-01-28 — End: 2021-06-15

## 2021-01-28 MED ORDER — NEBULIZER MASK ADULT MISC: 1.0000 | 1 refills | Status: AC

## 2021-01-28 MED ORDER — ALBUTEROL SULFATE HFA 108 (90 BASE) MCG/ACT IN AERS
INHALATION_SPRAY | RESPIRATORY_TRACT | 1 refills | Status: DC
Start: 2021-01-28 — End: 2021-06-15

## 2021-01-28 MED ORDER — FORMOTEROL FUMARATE 20 MCG/2ML IN NEBU: 20.0000 ug | INHALATION_SOLUTION | Freq: Two times a day (BID) | RESPIRATORY_TRACT | 3 refills | Status: DC

## 2021-01-28 NOTE — Patient Instructions (Addendum)
Asthma - Resume Budesonide 0.5mg  twice a day mixed with Formoterol nebulized.  Mix Budesonide vial with Formoterol vial in nebulizer twice a day.   Do not use Formoterol alone.   - Use albuterol rescue inhaler or nebulizer as needed, up to 2 puffs every 4-6 hours during an asthma exacerbation.  - Recommend using albuterol prior to activity/exercise.  Use the albuterol pump inhaler with spacer.  Use 2 puffs 15-20 minutes prior to exercise/activity  - Let us know if you are not meeting the following goals.   Asthma control goals:  Full participation in all desired activities (may need albuterol before activity) Albuterol use two time or less a week on average (not counting use with activity) Cough interfering with sleep two time or less a month Oral steroids no more than once a year No hospitalizations  Allergic rhinitis - Resume Xyzal 5mg  daily during the week.  If needed can take extra dose in the AM.   - Resume nasal Atrovent 1-2 sprays each nostril 1-2 times a day for nasal congestion or drainage - Use nasal saline spray daily to help decrease risk of nosebleeds - Continue montelukast 10 mg daily - Use Pataday 1 drop each eye daily as needed for itchy/watery eyes. - Continue to use dust covers for dust mite control  Food allergies  - Continue to avoid all shellfish - Have access to your EpiPen at all times, and follow your emergency action plan.   Follow up in 4-6 months or sooner if needed.

## 2021-01-28 NOTE — Progress Notes (Signed)
Follow-up Note  RE: Lauren Cain MRN: [id number] DOB: 2000/11/15 Date of Office Visit: 01/28/2021   History of present illness: Lauren Cain is a 19 y.o. female presenting today for follow-up of asthma, allergic rhinitis and food allergy.  She was last seen in the office on 05/05/2020 by myself.  She presents today with her mother.  She has been out of all of her medications for at least the past 3 weeks.  Due to this mother has noted more cough symptoms.  She is supposed to be on budesonide and formoterol vials twice a day.  She is also out of her albuterol that she has not had this to relieve the cough symptoms. She also has been noting more nasal drainage as she is out of her nasal Atrovent and Xyzal.  She also has noticed some itchy eyes.  Mother states she sees her rubbing her eyes a lot.  She states she has had a couple of nosebleeds as well.  The nosebleeds resolved quickly. 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  Review of systems: Review of Systems  Constitutional: Negative.   HENT:  Positive for rhinorrhea.   Eyes:  Positive for itching.  Respiratory:  Positive for cough.   Cardiovascular: Negative.   Gastrointestinal: Negative.   Musculoskeletal: Negative.   Skin: Negative.   Allergic/Immunologic: Negative.   Neurological: Negative.     All other systems negative unless noted above in HPI  Past medical/social/surgical/family history have been reviewed and are unchanged unless specifically indicated below.  No changes  Medication List: Current Outpatient Medications  Medication Sig Dispense Refill   albuterol (PROAIR HFA) 108 (90 Base) MCG/ACT inhaler INHALE 2 PUFFS INTO LUNGS EVERY 4 HOURS AS NEEDED FOR COUGH OR WHEEZE 18 g 0   albuterol (PROVENTIL) (2.5 MG/3ML) 0.083% nebulizer solution USE 1 VIAL IN NEBULIZER EVERY 4 HOURS AS NEEDED FOR WHEEZING/SHORTNESS OF BREATH 75 mL 3    budesonide (PULMICORT) 0.5 MG/2ML nebulizer solution Take 2 mLs (0.5 mg total) by nebulization 2 (two) times daily. 75 mL 5   clotrimazole (LOTRIMIN) 1 % cream Apply 1 application topically 2 (two) times daily. To foot 30 g 0   diphenhydrAMINE (BENADRYL) 12.5 MG/5ML elixir TAKE ONE TIME AS NEEDED FOR UP TO 1 DOSE FOR ALLERGIES CAN GIVE 2ND DOSE AFTER 15 MINUTES)     EPINEPHrine 0.3 mg/0.3 mL IJ SOAJ injection Inject 0.3 mLs (0.3 mg total) into the muscle as needed for anaphylaxis. 2 each 1   formoterol (PERFOROMIST) 20 MCG/2ML nebulizer solution Take 2 mLs (20 mcg total) by nebulization 2 (two) times daily. Mix with Budesonide in vial 120 mL 5   ipratropium (ATROVENT) 0.06 % nasal spray 1-2 Sprays each nostril 1-2 times a day for nasal congestion or drainage 15 mL 5   levocetirizine (XYZAL) 5 MG tablet TAKE 1 TABLET BY MOUTH EVERY DAY IN THE EVENING 30 tablet 0   medroxyPROGESTERone (DEPO-PROVERA) 150 MG/ML injection Inject 150 mg into the muscle every 3 (three) months.     mometasone (NASONEX) 50 MCG/ACT nasal spray Place 2 sprays into the nose daily. 17 g 5   montelukast (SINGULAIR) 10 MG tablet TAKE 1 TABLET BY MOUTH EVERYDAY AT BEDTIME 30 tablet 0   NON FORMULARY [facility]  Antifungal Nail - Terbinafine 3$; Fluconazole 2% Teat Tree Oil; 5% Urea; 10% Ibuprofen; 2% in DMSO Suspension #9mL Apply to the affected nail(s) once (  at bedtime) or twice daily  Refills-as needed  Faxed over to [facility] on 01/21/19     polyethylene glycol (MIRALAX / GLYCOLAX) 17 g packet Take 17 g by mouth daily. 14 each 3   risperiDONE (RISPERDAL) 1 MG tablet TAKE 1 TABLET EVERY DAY 31 tablet 2   sertraline (ZOLOFT) 50 MG tablet TAKE 1 TABLET BY MOUTH DAILY WITH DINNER FOR PTSD     Spacer/Aero Chamber Mouthpiece MISC Use as directed. 1 each 0   No current facility-administered medications for this visit.     Known medication allergies: Allergies  Allergen Reactions   Shellfish-Derived  Products Swelling   Cats Claw [Uncaria Tomentosa (Cats Claw)]    Dust Mite Extract    Other     TREES   Shellfish Allergy    Tree Extract      Physical examination: Blood pressure 118/82, pulse (!) 127, temperature 98.4 F (36.9 C), resp. rate 20, height 5\' 2"  (1.575 m), weight 134 lb 9.6 oz (61.1 kg), SpO2 99 %.  General: Alert, interactive, in no acute distress. HEENT: PERRLA, TMs pearly gray, turbinates moderately edematous with clear discharge, post-pharynx non erythematous. Neck: Supple without lymphadenopathy. Lungs: Clear to auscultation without wheezing, rhonchi or rales. {no increased work of breathing. CV: Normal S1, S2 without murmurs. Abdomen: Nondistended, nontender. Skin: Warm and dry, without lesions or rashes. Extremities:  No clubbing, cyanosis or edema. Neuro:   Grossly intact.  Diagnositics/Labs:  Spirometry: FEV1: 1.9 L 71%, FVC: 2.19 L 73% predicted.  This is a reduced lung function study from her last visit.  Most likely reduced because she has been off of her maintenance medications   Assessment and plan:   Asthma - Resume Budesonide 0.5mg  twice a day mixed with Formoterol nebulized.  Mix Budesonide vial with Formoterol vial in nebulizer twice a day.   Do not use Formoterol alone.   - Use albuterol rescue inhaler or nebulizer as needed, up to 2 puffs every 4-6 hours during an asthma exacerbation.  - Recommend using albuterol prior to activity/exercise.  Use the albuterol pump inhaler with spacer.  Use 2 puffs 15-20 minutes prior to exercise/activity  - Let know if you are not meeting the following goals.   Asthma control goals:  Full participation in all desired activities (may need albuterol before activity) Albuterol use two time or less a week on average (not counting use with activity) Cough interfering with sleep two time or less a month Oral steroids no more than once a year No hospitalizations  Allergic rhinitis - Resume Xyzal 5mg  daily  during the week.  If needed can take extra dose in the AM.   - Resume nasal Atrovent 1-2 sprays each nostril 1-2 times a day for nasal congestion or drainage - Use nasal saline spray daily to help decrease risk of nosebleeds - Continue montelukast 10 mg daily - Use Pataday 1 drop each eye daily as needed for itchy/watery eyes. - Continue to use dust covers for dust mite control  Food allergies  - Continue to avoid all shellfish - Have access to your EpiPen at all times, and follow your emergency action plan.   Follow up in 4-6 months or sooner if needed.   I appreciate the opportunity to take part in Lauren Cain's care. Please do not hesitate to contact me with questions.  Sincerely,   [place], MD Allergy/Immunology [facility]

## 2021-01-31 ENCOUNTER — Other Ambulatory Visit: Payer: Self-pay | Admitting: *Deleted

## 2021-01-31 ENCOUNTER — Telehealth: Payer: Self-pay | Admitting: *Deleted

## 2021-01-31 NOTE — Addendum Note (Signed)
Addended by: Rolland Bimler D on: 01/31/2021 04:57 PM   Modules accepted: Orders

## 2021-01-31 NOTE — Telephone Encounter (Signed)
PA has been submitted through [facility] for Formoterol nebulizer solution and has been approved. PA has been faxed to patients pharmacy, labeled, and placed in bulk scanning.

## 2021-02-01 ENCOUNTER — Ambulatory Visit (INDEPENDENT_AMBULATORY_CARE_PROVIDER_SITE_OTHER): Payer: Medicaid Other

## 2021-02-01 DIAGNOSIS — Z3042 Encounter for surveillance of injectable contraceptive: Secondary | ICD-10-CM

## 2021-02-01 MED ORDER — MEDROXYPROGESTERONE ACETATE 150 MG/ML IM SUSP
150.0000 mg | Freq: Once | INTRAMUSCULAR | Status: AC
Start: 2021-02-01 — End: 2021-02-01
  Administered 2021-02-01: 09:00:00 150 mg via INTRAMUSCULAR

## 2021-02-01 NOTE — Progress Notes (Signed)
Pt presents for depo injection. Pt within depo window, no urine hcg needed. Injection given, tolerated well. F/u depo injection visit scheduled.   

## 2021-02-08 ENCOUNTER — Telehealth: Payer: Medicaid Other | Admitting: Family

## 2021-02-08 ENCOUNTER — Encounter: Payer: Self-pay | Admitting: Family

## 2021-02-08 DIAGNOSIS — Z789 Other specified health status: Secondary | ICD-10-CM

## 2021-02-08 NOTE — Progress Notes (Signed)
Patient not seen. Needs appt to discuss long-term Depo use. Please reschedule.

## 2021-02-09 ENCOUNTER — Encounter: Payer: Self-pay | Admitting: Family

## 2021-02-09 ENCOUNTER — Telehealth (INDEPENDENT_AMBULATORY_CARE_PROVIDER_SITE_OTHER): Payer: Medicaid Other | Admitting: Family

## 2021-02-09 DIAGNOSIS — Z789 Other specified health status: Secondary | ICD-10-CM

## 2021-02-09 DIAGNOSIS — Z3042 Encounter for surveillance of injectable contraceptive: Secondary | ICD-10-CM
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
## 2021-02-28 ENCOUNTER — Other Ambulatory Visit: Payer: Self-pay | Admitting: Pediatrics

## 2021-02-28 DIAGNOSIS — Z789 Other specified health status: Secondary | ICD-10-CM

## 2021-03-01 ENCOUNTER — Other Ambulatory Visit: Payer: Self-pay | Admitting: Pediatrics

## 2021-03-01 DIAGNOSIS — Z789 Other specified health status: Secondary | ICD-10-CM

## 2021-03-16 ENCOUNTER — Ambulatory Visit (HOSPITAL_BASED_OUTPATIENT_CLINIC_OR_DEPARTMENT_OTHER)
Admission: RE | Admit: 2021-03-16 | Discharge: 2021-03-16 | Disposition: A | Discharge status: Home / Self Care | Payer: Medicaid Other | Source: Ambulatory Visit | Attending: Pediatrics | Admitting: Pediatrics

## 2021-03-16 ENCOUNTER — Other Ambulatory Visit: Payer: Self-pay

## 2021-03-16 DIAGNOSIS — Z789 Other specified health status: Secondary | ICD-10-CM | POA: Insufficient documentation

## 2021-03-18 ENCOUNTER — Other Ambulatory Visit: Payer: Self-pay

## 2021-03-18 ENCOUNTER — Telehealth: Payer: Medicaid Other | Admitting: Family

## 2021-03-18 DIAGNOSIS — Z789 Other specified health status: Secondary | ICD-10-CM

## 2021-03-18 NOTE — Progress Notes (Signed)
Patient not seen. Mom unable to connect to video. Closed for admin purposes. Will schedule in person follow-up.

## 2021-03-21 ENCOUNTER — Encounter: Payer: Self-pay | Admitting: Family

## 2021-03-24 ENCOUNTER — Telehealth: Payer: Medicaid Other | Admitting: Family

## 2021-03-28 ENCOUNTER — Ambulatory Visit (INDEPENDENT_AMBULATORY_CARE_PROVIDER_SITE_OTHER): Payer: Medicaid Other | Admitting: Family

## 2021-03-28 ENCOUNTER — Encounter: Payer: Self-pay | Admitting: Family

## 2021-03-28 ENCOUNTER — Other Ambulatory Visit: Payer: Self-pay

## 2021-03-28 VITALS — BP 117/75 | HR 89 | Ht 62.0 in | Wt 130.8 lb

## 2021-03-28 DIAGNOSIS — M8080XA Other osteoporosis with current pathological fracture, unspecified site, initial encounter for fracture: Secondary | ICD-10-CM

## 2021-03-28 DIAGNOSIS — F79 Unspecified intellectual disabilities: Secondary | ICD-10-CM

## 2021-03-28 DIAGNOSIS — Z30017 Encounter for initial prescription of implantable subdermal contraceptive: Secondary | ICD-10-CM

## 2021-03-28 MED ORDER — CALCIUM 600 MG PO TABS: 600.0000 mg | ORAL_TABLET | Freq: Every day | ORAL | 2 refills | Status: DC

## 2021-03-28 MED ORDER — ETONOGESTREL 68 MG ~~LOC~~ IMPL
68.0000 mg | DRUG_IMPLANT | Freq: Once | SUBCUTANEOUS | Status: AC
  Administered 2021-03-28: 68 mg via SUBCUTANEOUS

## 2021-03-28 MED ORDER — VITAMIN D 50 MCG (2000 UT) PO TABS: 2000.0000 [IU] | ORAL_TABLET | Freq: Every day | ORAL | 2 refills | Status: AC

## 2021-03-28 NOTE — Procedures (Signed)
Nexplanon Insertion  No contraindications for placement.  No liver disease, no unexplained vaginal bleeding, no h/o breast cancer, no h/o blood clots.  No LMP recorded. Patient has had an injection.  UHCG: negativ  Last Unprotected sex:  NA  Risks & benefits of Nexplanon discussed The nexplanon device was purchased and supplied by [facility]. Packaging instructions supplied to patient Consent form signed  The patient denies any allergies to anesthetics or antiseptics.  Procedure: Pt was placed in supine position. The left arm was flexed at the elbow and externally rotated so that her wrist was parallel to her ear The medial epicondyle of the left arm was identified The insertions site was marked 8 cm proximal to the medial epicondyle The insertion site was cleaned with Betadine The area surrounding the insertion site was covered with a sterile drape 1% lidocaine was injected just under the skin at the insertion site extending 4 cm proximally. The sterile preloaded disposable Nexaplanon applicator was removed from the sterile packaging The applicator needle was inserted at a 30 degree angle at 8 cm proximal to the medial epicondyle as marked The applicator was lowered to a horizontal position and advanced just under the skin for the full length of the needle The slider on the applicator was retracted fully while the applicator remained in the same position, then the applicator was removed. The implant was confirmed via palpation as being in position The implant position was demonstrated to the patient Pressure dressing was applied to the patient.  The patient was instructed to removed the pressure dressing in 24 hrs.  The patient was advised to move slowly from a supine to an upright position  The patient denied any concerns or complaints  The patient was instructed to schedule a follow-up appt in 1 month and to call sooner if any concerns.  The patient acknowledged agreement and  understanding of the plan.
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
## 2021-03-28 NOTE — Patient Instructions (Signed)
° °  Congratulations on getting your Nexplanon placement!  Below is some important information about Nexplanon. ° °First remember that Nexplanon does not prevent sexually transmitted infections.  Condoms will help prevent sexually transmitted infections. °The Nexplanon starts working 7 days after it was inserted.  There is a risk of getting pregnant if you have unprotected sex in those first 7 days after placement of the Nexplanon. ° °The Nexplanon lasts for 3 years but can be removed at any time.  You can become pregnant as early as 1 week after removal.  You can have a new Nexplanon put in after the old one is removed if you like. ° °It is not known whether Nexplanon is as effective in women who are very overweight because the studies did not include many overweight women. ° °Nexplanon interacts with some medications, including barbiturates, bosentan, carbamazepine, felbamate, griseofulvin, oxcarbazepine, phenytoin, rifampin, St. John's wort, topiramate, HIV medicines.  Please alert your doctor if you are on any of these medicines. ° °Always tell other healthcare providers that you have a Nexplanon in your arm. ° °The Nexplanon was placed just under the skin.  Leave the outside bandage on for 24 hours.  Leave the smaller bandage on for 3-5 days or until it falls off on its own.  Keep the area clean and dry for 3-5 days. °There is usually bruising or swelling at the insertion site for a few days to a week after placement.  If you see redness or pus draining from the insertion site, call us immediately. ° °Keep your user card with the date the implant was placed and the date the implant is to be removed. ° °The most common side effect is a change in your menstrual bleeding pattern.   This bleeding is generally not harmful to you but can be annoying.  Call or come in to see us if you have any concerns about the bleeding or if you have any side effects or questions.   ° °We will call you in 1 week to check in and we  would like you to return to the clinic for a follow-up visit in 1 month. ° °You can call [facility] 24 hours a day with any questions or concerns.  There is always a nurse or doctor available to take your call.  Call 9-1-1 if you have a life-threatening emergency.  For anything else, please call us at [phone] before heading to the ER. °

## 2021-03-31 ENCOUNTER — Telehealth: Payer: Self-pay

## 2021-03-31 NOTE — Telephone Encounter (Signed)
Parent called concerned about nexplanon site. Her arm has a green/purple area where insertion took place. Reassured that it is likely bruising. No drainage, no redness or streaking, not warm to touch. Swelling has went down since yesterday. Gave strict return precautions regarding infection at the site. Mom to continue to monitor and will return to clinic with new or worsening symptoms.

## 2021-04-19 ENCOUNTER — Ambulatory Visit: Payer: Medicaid Other

## 2021-04-25 ENCOUNTER — Ambulatory Visit: Payer: Medicaid Other | Admitting: Family

## 2021-05-02 ENCOUNTER — Ambulatory Visit (INDEPENDENT_AMBULATORY_CARE_PROVIDER_SITE_OTHER): Payer: Medicaid Other | Admitting: Family

## 2021-05-02 ENCOUNTER — Other Ambulatory Visit: Payer: Self-pay

## 2021-05-02 ENCOUNTER — Encounter: Payer: Self-pay | Admitting: Family

## 2021-05-02 VITALS — BP 134/82 | HR 89 | Ht 62.0 in | Wt 128.4 lb

## 2021-05-02 DIAGNOSIS — Z7187 Encounter for pediatric-to-adult transition counseling: Secondary | ICD-10-CM | POA: Diagnosis not present

## 2021-05-02 DIAGNOSIS — M810 Age-related osteoporosis without current pathological fracture: Secondary | ICD-10-CM

## 2021-05-02 DIAGNOSIS — N946 Dysmenorrhea, unspecified: Secondary | ICD-10-CM

## 2021-05-02 DIAGNOSIS — Z975 Presence of (intrauterine) contraceptive device: Secondary | ICD-10-CM | POA: Diagnosis not present

## 2021-05-02 NOTE — Patient Instructions (Signed)
Your implant site looks good.  ?Follow up as needed.  ? ?I placed a referral for Primary Care at [facility]. You can call to schedule there!  ?
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
## 2021-05-10 ENCOUNTER — Telehealth: Payer: Self-pay | Admitting: Allergy

## 2021-05-10 NOTE — Telephone Encounter (Signed)
Called and spoke with patients mother and advised that I will place a nebulizer up front for pickup. Patients mother verbalized understanding.  ?

## 2021-05-10 NOTE — Telephone Encounter (Signed)
Patient mom called and said that the nebulizer machine is not working and called [facility] and they are not working with nebulizer and had this one a while/.. [phone] ?

## 2021-06-03 ENCOUNTER — Ambulatory Visit: Payer: Medicaid Other | Admitting: Allergy

## 2021-06-15 ENCOUNTER — Encounter: Payer: Self-pay | Admitting: Allergy

## 2021-06-15 ENCOUNTER — Ambulatory Visit (INDEPENDENT_AMBULATORY_CARE_PROVIDER_SITE_OTHER): Payer: Medicaid Other | Admitting: Allergy

## 2021-06-15 VITALS — BP 114/72 | HR 98 | Temp 97.5°F | Resp 16 | Ht 61.81 in | Wt 128.8 lb

## 2021-06-15 DIAGNOSIS — T7800XD Anaphylactic reaction due to unspecified food, subsequent encounter: Secondary | ICD-10-CM

## 2021-06-15 DIAGNOSIS — J4520 Mild intermittent asthma, uncomplicated: Secondary | ICD-10-CM | POA: Diagnosis not present

## 2021-06-15 DIAGNOSIS — J3089 Other allergic rhinitis: Secondary | ICD-10-CM | POA: Diagnosis not present

## 2021-06-15 DIAGNOSIS — H1013 Acute atopic conjunctivitis, bilateral: Secondary | ICD-10-CM

## 2021-06-15 MED ORDER — IPRATROPIUM BROMIDE 0.06 % NA SOLN: NASAL | 5 refills | Status: DC

## 2021-06-15 MED ORDER — BUDESONIDE 0.5 MG/2ML IN SUSP: 0.5000 mg | Freq: Two times a day (BID) | RESPIRATORY_TRACT | 3 refills | Status: DC

## 2021-06-15 MED ORDER — FORMOTEROL FUMARATE 20 MCG/2ML IN NEBU: 20.0000 ug | INHALATION_SOLUTION | Freq: Two times a day (BID) | RESPIRATORY_TRACT | 3 refills | Status: DC

## 2021-06-15 MED ORDER — MONTELUKAST SODIUM 10 MG PO TABS: 10.0000 mg | ORAL_TABLET | Freq: Every day | ORAL | 5 refills | Status: DC

## 2021-06-15 MED ORDER — OLOPATADINE HCL 0.2 % OP SOLN: OPHTHALMIC | 5 refills | Status: DC

## 2021-06-15 MED ORDER — ALBUTEROL SULFATE (2.5 MG/3ML) 0.083% IN NEBU: INHALATION_SOLUTION | RESPIRATORY_TRACT | 3 refills | Status: DC

## 2021-06-15 MED ORDER — LEVOCETIRIZINE DIHYDROCHLORIDE 5 MG PO TABS: 5.0000 mg | ORAL_TABLET | Freq: Every evening | ORAL | 5 refills | Status: DC

## 2021-06-15 MED ORDER — OLOPATADINE HCL 0.2 % OP SOLN
1.0000 [drp] | Freq: Every day | OPHTHALMIC | 5 refills | Status: DC | PRN
Start: 2021-06-15 — End: 2021-11-28

## 2021-06-15 MED ORDER — EPINEPHRINE 0.3 MG/0.3ML IJ SOAJ: 0.3000 mg | INTRAMUSCULAR | 1 refills | Status: DC | PRN

## 2021-06-15 MED ORDER — ALBUTEROL SULFATE HFA 108 (90 BASE) MCG/ACT IN AERS: 2.0000 | INHALATION_SPRAY | Freq: Four times a day (QID) | RESPIRATORY_TRACT | 2 refills | Status: DC | PRN

## 2021-06-15 NOTE — Patient Instructions (Signed)
Asthma ?- Continue Budesonide 0.5mg  mixed with Formoterol nebulized daily.  If having asthma flare or respiratory illness then use both medications nebulized twice a daily until symptoms resolve.  ?- Use albuterol rescue inhaler or nebulizer as needed, up to 2 puffs every 4-6 hours during an asthma exacerbation.  ?- Recommend using albuterol prior to activity/exercise.  Use the albuterol pump inhaler with spacer.  Use 2 puffs 15-20 minutes prior to exercise/activity  ?- Let us know if you are not meeting the following goals. ? ? Asthma control goals:  ?Full participation in all desired activities (may need albuterol before activity) ?Albuterol use two time or less a week on average (not counting use with activity) ?Cough interfering with sleep two time or less a month ?Oral steroids no more than once a year ?No hospitalizations ? ?Allergic rhinitis ?- Continue Xyzal 5mg  daily during the week.  If needed can take extra dose in the AM.   ?- Continue nasal Atrovent 1-2 sprays each nostril 1-2 times a day for nasal congestion or drainage ?- Use nasal saline spray daily to help decrease risk of nosebleeds ?- Continue montelukast 10 mg daily ?- Use Pataday 1 drop each eye daily as needed for itchy/watery eyes. ?- Continue to use dust covers for dust mite control ? ?Food allergies  ?- Continue to avoid all shellfish ?- Have access to your EpiPen at all times, and follow your emergency action plan. ? ? ?Follow up in 6 months or sooner if needed.   ?

## 2021-06-15 NOTE — Progress Notes (Signed)
? ? ?Follow-up Note ? ?RE: Lauren ChadShermia Bisher MRN: [id number] DOB: 10/17/2000 ?Date of Office Visit: 06/15/2021 ? ? ?History of present illness: ?Lauren Cain is a 21 y.o. female presenting today for follow-up of asthma, allergic rhinitis, food allergy.  She was last seen in the office on 01/28/2021 by myself.  She presents today with her mother.  She has not had any major health changes, surgeries or hospitalizations since her last visit. ? ?She states her asthma has been doing well.  She is currently using budesonide vials mixed with formoterol only once a day at this time.  She denies any use of albuterol via inhaler or nebulizer since the last visit.  She has not had any ED visits or urgent care visits or any systemic steroid needs. ? ?With her allergies she is taking Xyzal daily and does use nasal Atrovent twice a day.  Her biggest complaint at this point is itchy eyes.  She has responded in the past but needs refills. ? ?She continues to avoid shellfish and has no interest in eating shellfish at this time.  She has access to her epinephrine device which she has not needed to use. ? ?Review of systems: ?Review of Systems  ?Constitutional: Negative.   ?HENT: Negative.    ?Eyes:  Positive for itching.  ?Respiratory: Negative.    ?Cardiovascular: Negative.   ?Gastrointestinal: Negative.   ?Musculoskeletal: Negative.   ?Skin: Negative.   ?Allergic/Immunologic: Negative.   ?Neurological: Negative.    ? ?All other systems negative unless noted above in HPI ? ?Past medical/social/surgical/family history have been reviewed and are unchanged unless specifically indicated below. ? ?No changes ? ?Medication List: ?Current Outpatient Medications  ?Medication Sig Dispense Refill  ? Ascorbic Acid (VITAMIN C PO) Take by mouth daily.    ? calcium carbonate (OS-CAL) 600 MG tablet Take 1 tablet (600 mg total) by mouth daily. 30 tablet 2  ? Cholecalciferol (VITAMIN D) 50 MCG (2000 UT) tablet Take 1 tablet (2,000 Units total) by  mouth daily. 30 tablet 2  ? clotrimazole (LOTRIMIN) 1 % cream Apply 1 application topically 2 (two) times daily. To foot 30 g 0  ? diphenhydrAMINE (BENADRYL) 12.5 MG/5ML elixir TAKE 20ML ONE TIME AS NEEDED FOR UP TO 1 DOSE FOR ALLERGIES CAN GIVE 2ND DOSE AFTER 15 MINUTES)    ? Etonogestrel (NEXPLANON [place]) Inject into the skin.    ? mometasone (NASONEX) 50 MCG/ACT nasal spray Place 2 sprays into the nose daily. 17 g 5  ? Multiple Vitamin (MULTIVITAMIN ADULT PO) Take by mouth daily.    ? Olopatadine HCl (PATADAY) 0.2 % SOLN 1 drop each eye daily as needed for itchy/watery eyes 2.5 mL 5  ? Olopatadine HCl 0.2 % SOLN Apply 1 drop to eye daily as needed (Itchy, watery eyes). 2.5 mL 5  ? polyethylene glycol (MIRALAX / GLYCOLAX) 17 g packet Take 17 g by mouth daily. 14 each 3  ? Respiratory Therapy Supplies (NEBULIZER MASK ADULT) MISC Take 1 each by nebulization as directed. 1 each 1  ? risperiDONE (RISPERDAL) 1 MG tablet TAKE 1 TABLET EVERY DAY 31 tablet 2  ? sertraline (ZOLOFT) 50 MG tablet TAKE 1 TABLET BY MOUTH DAILY WITH DINNER FOR PTSD    ? Spacer/Aero Chamber QUALCOMMMouthpiece MISC Use as directed. 1 each 0  ? albuterol (PROVENTIL) (2.5 MG/3ML) 0.083% nebulizer solution USE 1 VIAL IN NEBULIZER EVERY 4 HOURS AS NEEDED FOR WHEEZING/SHORTNESS OF BREATH 75 mL 3  ? albuterol (VENTOLIN HFA) 108 (90 Base) MCG/ACT  inhaler Inhale 2 puffs into the lungs every 6 (six) hours as needed for wheezing or shortness of breath. 2 each 2  ? budesonide (PULMICORT) 0.5 MG/2ML nebulizer solution Take 2 mLs (0.5 mg total) by nebulization 2 (two) times daily. Take 1 vial twice a day mixed with Formoterol nebulized 120 mL 3  ? EPINEPHrine 0.3 mg/0.3 mL IJ SOAJ injection Inject 0.3 mg into the muscle as needed for anaphylaxis. 2 each 1  ? formoterol (PERFOROMIST) 20 MCG/2ML nebulizer solution Take 2 mLs (20 mcg total) by nebulization 2 (two) times daily. Mix Budesonide vial with Formoterol vial in nebulizer twice a day. Do not use Formoterol alone.  120 mL 3  ? ipratropium (ATROVENT) 0.06 % nasal spray 1-2 Sprays each nostril 1-2 times a day for nasal congestion or drainage 15 mL 5  ? levocetirizine (XYZAL) 5 MG tablet Take 1 tablet (5 mg total) by mouth every evening. 30 tablet 5  ? montelukast (SINGULAIR) 10 MG tablet Take 1 tablet (10 mg total) by mouth at bedtime. 30 tablet 5  ? NON FORMULARY [facility] ? ?Antifungal Nail - Terbinafine 3$; Fluconazole 2% Teat Tree Oil; 5% Urea; 10% Ibuprofen; 2% in DMSO Suspension #71mL ?Apply to the affected nail(s) once (at bedtime) or twice daily ? ?Refills-as needed ? ?Faxed over to [facility] on 01/21/19 (Patient not taking: Reported on 06/15/2021)    ? ?No current facility-administered medications for this visit.  ?  ? ?Known medication allergies: ?Allergies  ?Allergen Reactions  ? Shellfish-Derived Products Swelling  ? Cats Claw [Uncaria Tomentosa (Cats Claw)]   ? Dust Mite Extract   ? Other   ?  TREES  ? Shellfish Allergy   ? Tree Extract   ? ? ? ?Physical examination: ?Blood pressure 114/72, pulse 98, temperature (!) 97.5 ?F (36.4 ?C), temperature source Temporal, resp. rate 16, height 5' 1.81" (1.57 m), weight 128 lb 12.8 oz (58.4 kg), SpO2 96 %. ? ?General: Alert, interactive, in no acute distress. ?HEENT: PERRLA, TMs pearly gray, turbinates minimally edematous without discharge, post-pharynx non erythematous. ?Neck: Supple without lymphadenopathy. ?Lungs: Clear to auscultation without wheezing, rhonchi or rales. {no increased work of breathing. ?CV: Normal S1, S2 without murmurs. ?Abdomen: Nondistended, nontender. ?Skin: Warm and dry, without lesions or rashes. ?Extremities:  No clubbing, cyanosis or edema. ?Neuro:   Grossly intact. ? ?Diagnositics/Labs: ?Spirometry: FEV1: 1.87L 72%, FVC: 2.19L 75% predicted.  This is stable and quite normal for pt ? ?Assessment and plan: ?Asthma ?- Continue Budesonide 0.5mg  1 vial mixed with Formoterol 1 vial nebulized daily.  If having asthma flare or  respiratory illness then use both medications nebulized twice a daily until symptoms resolve.  ?- Use albuterol rescue inhaler or nebulizer as needed, up to 2 puffs every 4-6 hours during an asthma exacerbation.  ?- Recommend using albuterol prior to activity/exercise.  Use the albuterol pump inhaler with spacer.  Use 2 puffs 15-20 minutes prior to exercise/activity  ?- Let us know if you are not meeting the following goals. ? ? Asthma control goals:  ?Full participation in all desired activities (may need albuterol before activity) ?Albuterol use two time or less a week on average (not counting use with activity) ?Cough interfering with sleep two time or less a month ?Oral steroids no more than once a year ?No hospitalizations ? ?Allergic rhinitis ?- Continue Xyzal 5mg  daily during the week.  If needed can take extra dose in the AM.   ?- Continue nasal Atrovent 1-2 sprays each nostril  1-2 times a day for nasal congestion or drainage ?- Use nasal saline spray daily to help decrease risk of nosebleeds ?- Continue montelukast 10 mg daily ?- Use Pataday 1 drop each eye daily as needed for itchy/watery eyes. ?- Continue to use dust covers for dust mite control ? ?Food allergies  ?- Continue to avoid all shellfish ?- Have access to your EpiPen at all times, and follow your emergency action plan. ? ? ?Follow up in 6 months or sooner if needed.   ? ?I appreciate the opportunity to take part in Lauren Cain's care. Please do not hesitate to contact me with questions. ? ?Sincerely, ? ? ?Margo Aye, MD ?Allergy/Immunology ?[facility] ? ? ?

## 2021-06-17 ENCOUNTER — Other Ambulatory Visit: Payer: Self-pay

## 2021-06-17 MED ORDER — EPINEPHRINE 0.3 MG/0.3ML IJ SOAJ: 0.3000 mg | INTRAMUSCULAR | 1 refills | Status: DC | PRN

## 2021-08-09 ENCOUNTER — Other Ambulatory Visit: Payer: Self-pay

## 2021-08-09 MED ORDER — EPINEPHRINE 0.3 MG/0.3ML IJ SOAJ
0.3000 mg | INTRAMUSCULAR | 1 refills | Status: DC | PRN
Start: 2021-08-09 — End: 2021-12-30

## 2021-08-20 ENCOUNTER — Other Ambulatory Visit: Payer: Self-pay | Admitting: Family

## 2021-09-22 NOTE — Telephone Encounter (Signed)
Erroneous encounter

## 2021-09-30 ENCOUNTER — Other Ambulatory Visit: Payer: Self-pay | Admitting: Family

## 2021-10-14 ENCOUNTER — Ambulatory Visit (INDEPENDENT_AMBULATORY_CARE_PROVIDER_SITE_OTHER): Payer: Medicaid Other | Admitting: Family

## 2021-10-14 ENCOUNTER — Encounter: Payer: Self-pay | Admitting: Family

## 2021-10-14 VITALS — BP 105/67 | HR 84 | Ht 61.42 in | Wt 119.0 lb

## 2021-10-14 DIAGNOSIS — N921 Excessive and frequent menstruation with irregular cycle: Secondary | ICD-10-CM | POA: Diagnosis not present

## 2021-10-14 DIAGNOSIS — N946 Dysmenorrhea, unspecified: Secondary | ICD-10-CM

## 2021-10-14 DIAGNOSIS — Z975 Presence of (intrauterine) contraceptive device: Secondary | ICD-10-CM | POA: Diagnosis not present

## 2021-10-14 LAB — POCT HEMOGLOBIN: Hemoglobin: 13.7 g/dL (ref 11–14.6)

## 2021-10-14 MED ORDER — NORETHINDRONE ACETATE 5 MG PO TABS: 5.0000 mg | ORAL_TABLET | Freq: Every day | ORAL | 2 refills | Status: DC
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
## 2021-10-18 ENCOUNTER — Ambulatory Visit (INDEPENDENT_AMBULATORY_CARE_PROVIDER_SITE_OTHER): Payer: Medicaid Other | Admitting: Physician Assistant

## 2021-10-18 ENCOUNTER — Encounter: Payer: Self-pay | Admitting: Physician Assistant

## 2021-10-18 VITALS — BP 106/70 | HR 85 | Temp 98.3°F | Ht 61.0 in | Wt 117.2 lb

## 2021-10-18 DIAGNOSIS — R634 Abnormal weight loss: Secondary | ICD-10-CM | POA: Diagnosis not present

## 2021-10-18 DIAGNOSIS — F39 Unspecified mood [affective] disorder: Secondary | ICD-10-CM

## 2021-10-18 DIAGNOSIS — I340 Nonrheumatic mitral (valve) insufficiency: Secondary | ICD-10-CM

## 2021-10-18 DIAGNOSIS — Z8719 Personal history of other diseases of the digestive system: Secondary | ICD-10-CM | POA: Diagnosis not present

## 2021-10-18 DIAGNOSIS — Z23 Encounter for immunization: Secondary | ICD-10-CM

## 2021-10-18 DIAGNOSIS — F79 Unspecified intellectual disabilities: Secondary | ICD-10-CM

## 2021-10-18 MED ORDER — MULTIVITAMIN ADULT PO TABS: 1.0000 | ORAL_TABLET | Freq: Every day | ORAL | 1 refills | Status: AC

## 2021-10-18 MED ORDER — POLYETHYLENE GLYCOL 3350 17 G PO PACK: 17.0000 g | PACK | Freq: Every day | ORAL | 3 refills | Status: DC

## 2021-10-18 NOTE — Progress Notes (Signed)
Subjective:    Patient ID: Lauren Cain, female    DOB: 2000/08/26, 20 y.o.   MRN: [id number]  Chief Complaint  Patient presents with   Establish Care    Pt in establishing new pt care w/ PCP, no concerns to discuss; pt had CPE this year per Mom; Flu shot administered;     HPI 71 y.o. patient presents today for new patient establishment with me. Here with mom, Lauren Cain. Patient was previously established with Dr. Kennedy Bucker.  Current Care Team: No current specialists, but previously had pediatric cards for [facility]  Podiatry  [facility]  Acute Concerns: Flu shot today  Chronic Concerns: See PMH listed below, as well as A/P for details on issues we specifically discussed during today's visit.      Past Medical History:  Diagnosis Date   Asthma    Eczema    Mental impairment    Mitral valve prolapse     Past Surgical History:  Procedure Laterality Date   MOUTH SURGERY     x 2   TEAR DUCT PROBING Bilateral    TOENAIL EXCISION     removal    Family History  Problem Relation Age of Onset   Asthma Mother    Allergic rhinitis Mother    Obesity Mother    Allergic rhinitis Father     Social History   Tobacco Use   Smoking status: Never   Smokeless tobacco: Never  Vaping Use   Vaping Use: Never used  Substance Use Topics   Alcohol use: Not Currently   Drug use: Never     Allergies  Allergen Reactions   Shellfish-Derived Products Swelling   Cats Claw [Uncaria Tomentosa (Cats Claw)]    Dust Mite Extract    Other     TREES   Shellfish Allergy    Tree Extract     Review of Systems NEGATIVE UNLESS OTHERWISE INDICATED IN HPI      Objective:     BP 106/70 (BP Location: Left Arm)   Pulse 85   Temp 98.3 F (36.8 C) (Temporal)   Ht 5\' 1"  (1.549 m)   Wt 117 lb 3.2 oz (53.2 kg)   SpO2 97%   BMI 22.14 kg/m   Wt Readings from Last 3 Encounters:  10/18/21 117 lb 3.2 oz (53.2 kg)  10/14/21 119 lb (54 kg)  06/15/21 128 lb  12.8 oz (58.4 kg)    BP Readings from Last 3 Encounters:  10/18/21 106/70  10/14/21 105/67  06/15/21 114/72     Physical Exam Vitals and nursing note reviewed.  Constitutional:      Appearance: Normal appearance. She is normal weight. She is not toxic-appearing.  HENT:     Head: Normocephalic and atraumatic.     Right Ear: Tympanic membrane, ear canal and external ear normal.     Left Ear: Tympanic membrane, ear canal and external ear normal.     Nose: Nose normal.     Mouth/Throat:     Mouth: Mucous membranes are moist.  Eyes:     Extraocular Movements: Extraocular movements intact.     Conjunctiva/sclera: Conjunctivae normal.     Pupils: Pupils are equal, round, and reactive to light.  Cardiovascular:     Rate and Rhythm: Normal rate and regular rhythm.     Pulses: Normal pulses.     Heart sounds: Murmur heard.  Pulmonary:     Effort: Pulmonary effort is normal.     Breath  sounds: Normal breath sounds.  Abdominal:     General: Abdomen is flat. Bowel sounds are normal.     Palpations: Abdomen is soft.  Musculoskeletal:        General: Normal range of motion.     Cervical back: Normal range of motion and neck supple.  Skin:    General: Skin is warm and dry.  Neurological:     General: No focal deficit present.     Mental Status: She is alert and oriented to person, place, and time.  Psychiatric:        Mood and Affect: Mood normal.        Assessment & Plan:  Abnormal weight loss Assessment & Plan: -Has been told this is 2/2 discontinuing depo-shots this year; plan to monitor closely -No concerns on recent labs per mom.    History of constipation Assessment & Plan: Taking Miralax daily   Orders: -     Polyethylene Glycol 3350; Take 17 g by mouth daily.  Dispense: 14 each; Refill: 3  Mood disorder (HCC)  Mitral valve insufficiency, unspecified etiology Assessment & Plan: Has been seeing peds cardiology; referral for adult cardiology at this time.  Asymptomatic today   Orders: -     Ambulatory referral to Cardiology  Intellectual disability  Need for immunization against influenza -     Flu Vaccine QUAD 45mo+IM (Fluarix, Fluzone & Alfiuria Quad PF)  Other orders -     Multivitamin Adult; Take 1 tablet by mouth daily.  Dispense: 90 tablet; Refill: 1     Return in about 3 months (around 01/17/2022) for recheck.  This note was prepared with assistance of Conservation officer, historic buildings. Occasional wrong-word or sound-a-like substitutions may have occurred due to the inherent limitations of voice recognition software.    Dinari Stgermaine M Kaleeah Gingerich, PA-C

## 2021-10-18 NOTE — Patient Instructions (Addendum)
Welcome to [facility]! It was a pleasure meeting you today.   Please bring in most recent blood results.   Look into these places for day programs:   [facility]  Address: [address] [place], [place] [address]  Phone: [phone]  [facility] online [address], [address] [place], [place] [address] [phone]  Cardiology referral placed today.  PLEASE NOTE:  If you had any LAB tests please let us know if you have not heard back within a few days. You may see your results on MyChart before we have a chance to review them but we will give you a call once they are reviewed by [place]. If we ordered any REFERRALS today, please let us know if you have not heard from their office within the next two weeks. Let us know through MyChart if you are needing REFILLS, or have your pharmacy send [place] the request. You can also use MyChart to communicate with me or any office staff.  Please try these tips to maintain a healthy lifestyle:  Eat most of your calories during the day when you are active. Eliminate processed foods including packaged sweets (pies, cakes, cookies), reduce intake of potatoes, white bread, white pasta, and white rice. Look for whole grain options, oat flour or almond flour.  Each meal should contain half fruits/vegetables, one quarter protein, and one quarter carbs (no bigger than a computer mouse).  Cut down on sweet beverages. This includes juice, soda, and sweet tea. Also watch fruit intake, though this is a healthier sweet option, it still contains natural sugar! Limit to 3 servings daily.  Drink at least 1 glass of water with each meal and aim for at least 8 glasses (64 ounces) per day.  Exercise at least 150 minutes every week to the best of your ability.    Take Care,  Francois Elk, PA-C     *Please bring by a copy of most recent blood work

## 2021-10-23 NOTE — Assessment & Plan Note (Signed)
Taking Miralax daily

## 2021-10-23 NOTE — Assessment & Plan Note (Addendum)
Has been seeing peds cardiology; referral for adult cardiology at this time. Asymptomatic today

## 2021-10-23 NOTE — Assessment & Plan Note (Addendum)
-  Has been told this is 2/2 discontinuing depo-shots this year; plan to monitor closely -No concerns on recent labs per mom.

## 2021-11-19 ENCOUNTER — Other Ambulatory Visit: Payer: Self-pay | Admitting: Allergy

## 2021-11-19 ENCOUNTER — Other Ambulatory Visit: Payer: Self-pay | Admitting: Family

## 2021-11-27 NOTE — Progress Notes (Unsigned)
Cardiology Office Note:    Date:  11/28/2021   ID:  Lauren Cain, DOB 2000/02/26, MRN [id number]  PCP:  Allwardt, Randa Evens, [place] [facility] Cardiologist:  Werner Lean, MD     Referring MD: Fredirick Lathe, PA-C   CC: MVP Consulted for the evaluation of MVP and MR at the behest of Ms. Allwardt PA-C  History of Present Illness:    Lauren Cain is a 21 y.o. 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  Patient notes that she is feeling great.   Dance and works out  No symptoms. Walks outside with no issues.  Has had no chest pain, chest pressure, chest tightness, chest stinging .  No shortness of breath, DOE .  No PND or orthopnea.  No weight gain, leg swelling , or abdominal swelling.  No syncope or near syncope . Notes  no palpitations or funny heart beats.      Past Medical History:  Diagnosis Date   Abnormal weight loss    Asthma    Dysmenorrhea    Eczema    Mental impairment    Mitral valve prolapse    URI (upper respiratory infection)     Past Surgical History:  Procedure Laterality Date   MOUTH SURGERY     x 2   TEAR DUCT PROBING Bilateral    TOENAIL EXCISION     removal    Current Medications: Current Meds  Medication Sig   albuterol (PROVENTIL) (2.5 MG/3ML) 0.083% nebulizer solution USE 1 VIAL IN NEBULIZER EVERY 4 HOURS AS NEEDED FOR WHEEZING/SHORTNESS OF BREATH   albuterol (VENTOLIN HFA) 108 (90 Base) MCG/ACT inhaler Inhale 2 puffs into the lungs every 6 (six) hours as needed for wheezing or shortness of breath.   Ascorbic Acid (VITAMIN C PO) Take by mouth daily.   budesonide (PULMICORT) 0.5 MG/2ML nebulizer solution TAKE 2 MLS (0.5 MG TOTAL) BY NEBULIZATION 2 (TWO) TIMES DAILY. TAKE 1 VIAL TWICE A DAY MIXED WITH FORMOTEROL NEBULIZED   calcium carbonate (OS-CAL) 600 MG tablet TAKE 1 TABLET BY MOUTH EVERY DAY   Cholecalciferol (VITAMIN D) 50 MCG (2000 UT) tablet Take 1 tablet  (2,000 Units total) by mouth daily.   clotrimazole (LOTRIMIN) 1 % cream Apply 1 application topically 2 (two) times daily. To foot   diphenhydrAMINE (BENADRYL) 12.5 MG/5ML elixir TAKE 20ML ONE TIME AS NEEDED FOR UP TO 1 DOSE FOR ALLERGIES CAN GIVE 2ND DOSE AFTER 15 MINUTES)   EPINEPHrine 0.3 mg/0.3 mL IJ SOAJ injection Inject 0.3 mg into the muscle as needed for anaphylaxis.   Etonogestrel (NEXPLANON Stem) Inject into the skin.   formoterol (PERFOROMIST) 20 MCG/2ML nebulizer solution Take 2 mLs (20 mcg total) by nebulization 2 (two) times daily. Mix Budesonide vial with Formoterol vial in nebulizer twice a day. Do not use Formoterol alone.   ipratropium (ATROVENT) 0.06 % nasal spray 1-2 Sprays each nostril 1-2 times a day for nasal congestion or drainage   levocetirizine (XYZAL) 5 MG tablet Take 1 tablet (5 mg total) by mouth every evening.   mometasone (NASONEX) 50 MCG/ACT nasal spray Place 2 sprays into the nose daily.   montelukast (SINGULAIR) 10 MG tablet Take 1 tablet (10 mg total) by mouth at bedtime.   Multiple Vitamin (MULTIVITAMIN ADULT) TABS Take 1 tablet by mouth daily.   norethindrone (AYGESTIN) 5 MG tablet Take 1 tablet (5 mg total) by mouth daily.   Olopatadine HCl (  PATADAY) 0.2 % SOLN 1 drop each eye daily as needed for itchy/watery eyes   polyethylene glycol (MIRALAX / GLYCOLAX) 17 g packet Take 17 g by mouth daily. (Patient taking differently: Take 17 g by mouth as needed for mild constipation or moderate constipation.)   Respiratory Therapy Supplies (NEBULIZER MASK ADULT) MISC Take 1 each by nebulization as directed.   risperiDONE (RISPERDAL) 1 MG tablet TAKE 1 TABLET EVERY DAY   sertraline (ZOLOFT) 50 MG tablet TAKE 1 TABLET BY MOUTH DAILY WITH DINNER FOR PTSD   Spacer/Aero Chamber Mouthpiece MISC Use as directed.   [DISCONTINUED] NON FORMULARY [facility]  Antifungal Nail - Terbinafine 3$; Fluconazole 2% Teat Tree Oil; 5% Urea; 10% Ibuprofen; 2% in DMSO Suspension  #31mL Apply to the affected nail(s) once (at bedtime) or twice daily  Refills-as needed  Faxed over to [place] on 01/21/19   [DISCONTINUED] Olopatadine HCl 0.2 % SOLN Apply 1 drop to eye daily as needed (Itchy, watery eyes).     Allergies:   Shellfish-derived products, Cats claw [uncaria tomentosa (cats claw)], Dust mite extract, Other, Shellfish allergy, and Tree extract   Social History   Socioeconomic History   Marital status: Single    Spouse name: Not on file   Number of children: Not on file   Years of education: Not on file   Highest education level: Not on file  Occupational History   Not on file  Tobacco Use   Smoking status: Never   Smokeless tobacco: Never  Vaping Use   Vaping Use: Never used  Substance and Sexual Activity   Alcohol use: Not Currently   Drug use: Never   Sexual activity: Not Currently    Birth control/protection: Injection  Other Topics Concern   Not on file  Social History Narrative   Not on file   Social Determinants of Health   Financial Resource Strain: Not on file  Food Insecurity: Not on file  Transportation Needs: Not on file  Physical Activity: Not on file  Stress: Not on file  Social Connections: Not on file    Family History: The patient's family history includes Allergic rhinitis in her father and mother; Asthma in her mother; Obesity in her mother. Grandfather had a heart murmur; has a defibrillator.  ROS:   Please see the history of present illness.     All other systems reviewed and are negative.  EKGs/Labs/Other Studies Reviewed:    The following studies were reviewed today:  EKG:  EKG is  ordered today.  The ekg ordered today demonstrates  11/28/21 SR rate 84  Recent Labs: 10/14/2021: Hemoglobin 13.7  Recent Lipid Panel    Component Value Date/Time   CHOL 98 12/03/2018 1316   TRIG 48 12/03/2018 1316   HDL 47 12/03/2018 1316   CHOLHDL 2.1 12/03/2018 1316   VLDL 12 07/26/2016 1539   LDLCALC 37  12/03/2018 1316       Physical Exam:    VS:  BP 102/68   Pulse 84   Ht 5\' 6"  (1.676 m)   Wt 121 lb (54.9 kg)   SpO2 98%   BMI 19.53 kg/m     Wt Readings from Last 3 Encounters:  11/28/21 121 lb (54.9 kg)  10/18/21 117 lb 3.2 oz (53.2 kg)  10/14/21 119 lb (54 kg)    GEN:  Well nourished, well developed in no acute distress HEENT: Normal NECK: No JVD; No carotid bruits LYMPHATICS: No lymphadenopathy CARDIAC: RRR, holosystolic murmurs, rubs, gallops RESPIRATORY:  Clear to auscultation without rales, wheezing or rhonchi  ABDOMEN: Soft, non-tender, non-distended MUSCULOSKELETAL:  No edema; No deformity  SKIN: Warm and dry NEUROLOGIC:  Alert and oriented x 3 PSYCHIATRIC:  Normal affect   ASSESSMENT:    1. MVP (mitral valve prolapse)   2. Nonrheumatic mitral valve regurgitation   3. Family history of heart disease    PLAN:    MVP and MR without MAD Family history of heart disease without MVP - Mild to moderate MR in 2022 with pediatrics - we discussed the natural history of the disease with her  and her family - will get echo  Yearly f/u      Medication Adjustments/Labs and Tests Ordered: Current medicines are reviewed at length with the patient today.  Concerns regarding medicines are outlined above.  Orders Placed This Encounter  Procedures   EKG 12-Lead   ECHOCARDIOGRAM COMPLETE   No orders of the defined types were placed in this encounter.   Patient Instructions  Medication Instructions:  Your physician recommends that you continue on your current medications as directed. Please refer to the Current Medication list given to you today.  *If you need a refill on your cardiac medications before your next appointment, please call your pharmacy*   Lab Work: NONE  If you have labs (blood work) drawn today and your tests are completely normal, you will receive your results only by: MyChart Message (if you have MyChart) OR A paper copy in the mail If you  have any lab test that is abnormal or we need to change your treatment, we will call you to review the results.   Testing/Procedures: Your physician has requested that you have an echocardiogram. Echocardiography is a painless test that uses sound waves to create images of your heart. It provides your doctor with information about the size and shape of your heart and how well your heart's chambers and valves are working. This procedure takes approximately one hour. There are no restrictions for this procedure. Please do NOT wear cologne, perfume, aftershave, or lotions (deodorant is allowed). Please arrive 15 minutes prior to your appointment time.    Follow-Up: At [facility], you and your health needs are our priority.  As part of our continuing mission to provide you with exceptional heart care, we have created designated Provider Care Teams.  These Care Teams include your primary Cardiologist (physician) and Advanced Practice Providers (APPs -  Physician Assistants and Nurse Practitioners) who all work together to provide you with the care you need, when you need it.  Your next appointment:   1 year(s)  The format for your next appointment:   In Person  Provider:   Christell Constant, MD      Important Information About Sugar         Signed, Christell Constant, MD  11/28/2021 11:25 AM    [facility]

## 2021-11-28 ENCOUNTER — Other Ambulatory Visit: Payer: Self-pay | Admitting: Family

## 2021-11-28 ENCOUNTER — Encounter: Payer: Self-pay | Admitting: Internal Medicine

## 2021-11-28 ENCOUNTER — Ambulatory Visit: Payer: Medicaid Other | Attending: Internal Medicine | Admitting: Internal Medicine

## 2021-11-28 VITALS — BP 102/68 | HR 84 | Ht 66.0 in | Wt 121.0 lb

## 2021-11-28 DIAGNOSIS — I340 Nonrheumatic mitral (valve) insufficiency: Secondary | ICD-10-CM | POA: Insufficient documentation

## 2021-11-28 DIAGNOSIS — Z8249 Family history of ischemic heart disease and other diseases of the circulatory system: Secondary | ICD-10-CM | POA: Diagnosis present

## 2021-11-28 DIAGNOSIS — I341 Nonrheumatic mitral (valve) prolapse: Secondary | ICD-10-CM | POA: Diagnosis present

## 2021-11-28 NOTE — Patient Instructions (Signed)
Medication Instructions:  Your physician recommends that you continue on your current medications as directed. Please refer to the Current Medication list given to you today.  *If you need a refill on your cardiac medications before your next appointment, please call your pharmacy*   Lab Work: NONE  If you have labs (blood work) drawn today and your tests are completely normal, you will receive your results only by: [place] (if you have MyChart) OR A paper copy in the mail If you have any lab test that is abnormal or we need to change your treatment, we will call you to review the results.   Testing/Procedures: Your physician has requested that you have an echocardiogram. Echocardiography is a painless test that uses sound waves to create images of your heart. It provides your doctor with information about the size and shape of your heart and how well your heart's chambers and valves are working. This procedure takes approximately one hour. There are no restrictions for this procedure. Please do NOT wear cologne, perfume, aftershave, or lotions (deodorant is allowed). Please arrive 15 minutes prior to your appointment time.    Follow-Up: At [facility], you and your health needs are our priority.  As part of our continuing mission to provide you with exceptional heart care, we have created designated Provider Care Teams.  These Care Teams include your primary Cardiologist (physician) and Advanced Practice Providers (APPs -  Physician Assistants and Nurse Practitioners) who all work together to provide you with the care you need, when you need it.  Your next appointment:   1 year(s)  The format for your next appointment:   In Person  Provider:   Werner Lean, MD      Important Information About Sugar

## 2021-12-13 ENCOUNTER — Ambulatory Visit (HOSPITAL_COMMUNITY): Payer: Medicaid Other | Attending: Internal Medicine

## 2021-12-13 DIAGNOSIS — I341 Nonrheumatic mitral (valve) prolapse: Secondary | ICD-10-CM | POA: Insufficient documentation

## 2021-12-13 DIAGNOSIS — I340 Nonrheumatic mitral (valve) insufficiency: Secondary | ICD-10-CM | POA: Insufficient documentation

## 2021-12-13 LAB — ECHOCARDIOGRAM COMPLETE
Area-P 1/2: 3.76 cm2
S' Lateral: 2.6 cm

## 2021-12-15 ENCOUNTER — Telehealth: Payer: Self-pay | Admitting: Internal Medicine

## 2021-12-15 DIAGNOSIS — I340 Nonrheumatic mitral (valve) insufficiency: Secondary | ICD-10-CM

## 2021-12-15 NOTE — Telephone Encounter (Signed)
Called and spoke directly with patient who understands results of ECHO and need to repeat in 1 year. Order placed at this time.

## 2021-12-15 NOTE — Telephone Encounter (Signed)
-----   Message from Werner Lean, MD sent at 12/15/2021  2:41 PM EDT ----- Moderate MR Echo in one year

## 2021-12-30 ENCOUNTER — Ambulatory Visit (INDEPENDENT_AMBULATORY_CARE_PROVIDER_SITE_OTHER): Payer: Medicaid Other | Admitting: Allergy

## 2021-12-30 ENCOUNTER — Other Ambulatory Visit: Payer: Self-pay

## 2021-12-30 ENCOUNTER — Encounter: Payer: Self-pay | Admitting: Allergy

## 2021-12-30 VITALS — BP 114/60 | HR 88 | Temp 98.3°F | Resp 18 | Ht 63.0 in | Wt 125.0 lb

## 2021-12-30 DIAGNOSIS — T7800XD Anaphylactic reaction due to unspecified food, subsequent encounter: Secondary | ICD-10-CM

## 2021-12-30 DIAGNOSIS — J3089 Other allergic rhinitis: Secondary | ICD-10-CM | POA: Diagnosis not present

## 2021-12-30 DIAGNOSIS — J4520 Mild intermittent asthma, uncomplicated: Secondary | ICD-10-CM

## 2021-12-30 DIAGNOSIS — H1013 Acute atopic conjunctivitis, bilateral: Secondary | ICD-10-CM

## 2021-12-30 DIAGNOSIS — J4521 Mild intermittent asthma with (acute) exacerbation: Secondary | ICD-10-CM

## 2021-12-30 DIAGNOSIS — L219 Seborrheic dermatitis, unspecified: Secondary | ICD-10-CM

## 2021-12-30 MED ORDER — BUDESONIDE 0.5 MG/2ML IN SUSP: 0.5000 mg | Freq: Two times a day (BID) | RESPIRATORY_TRACT | 0 refills | Status: DC

## 2021-12-30 MED ORDER — EPINEPHRINE 0.3 MG/0.3ML IJ SOAJ: 0.3000 mg | INTRAMUSCULAR | 1 refills | Status: DC | PRN

## 2021-12-30 MED ORDER — KETOCONAZOLE 2 % EX SHAM: MEDICATED_SHAMPOO | CUTANEOUS | 5 refills | Status: DC

## 2021-12-30 MED ORDER — MONTELUKAST SODIUM 10 MG PO TABS: 10.0000 mg | ORAL_TABLET | Freq: Every day | ORAL | 5 refills | Status: DC

## 2021-12-30 MED ORDER — IPRATROPIUM BROMIDE 0.06 % NA SOLN: NASAL | 5 refills | Status: DC

## 2021-12-30 MED ORDER — ALBUTEROL SULFATE (2.5 MG/3ML) 0.083% IN NEBU: INHALATION_SOLUTION | RESPIRATORY_TRACT | 5 refills | Status: DC

## 2021-12-30 MED ORDER — ALBUTEROL SULFATE HFA 108 (90 BASE) MCG/ACT IN AERS: 2.0000 | INHALATION_SPRAY | Freq: Four times a day (QID) | RESPIRATORY_TRACT | 2 refills | Status: DC | PRN

## 2021-12-30 MED ORDER — LEVOCETIRIZINE DIHYDROCHLORIDE 5 MG PO TABS: 5.0000 mg | ORAL_TABLET | Freq: Every evening | ORAL | 5 refills | Status: DC

## 2021-12-30 MED ORDER — CROMOLYN SODIUM 4 % OP SOLN: OPHTHALMIC | 5 refills | Status: DC

## 2021-12-30 MED ORDER — PREDNISONE 10 MG PO TABS: 30.0000 mg | ORAL_TABLET | Freq: Every day | ORAL | 0 refills | Status: AC

## 2021-12-30 MED ORDER — FORMOTEROL FUMARATE 20 MCG/2ML IN NEBU: 20.0000 ug | INHALATION_SOLUTION | Freq: Two times a day (BID) | RESPIRATORY_TRACT | 5 refills | Status: DC

## 2021-12-30 NOTE — Patient Instructions (Addendum)
Asthma with current flare - use Budesonide 0.5mg  mixed with Formoterol nebulized twice daily (in morning and at night).  - Use albuterol rescue inhaler or nebulizer as needed, up to 2 puffs every 4-6 hours during an asthma exacerbation.  - Recommend using albuterol prior to activity/exercise.  Use the albuterol pump inhaler with spacer.  Use 2 puffs 15-20 minutes prior to exercise/activity  - Take prednisone as directed.  Sending prescription to pharmacy - Let us know if you are not meeting the following goals.   Asthma control goals:  Full participation in all desired activities (may need albuterol before activity) Albuterol use two time or less a week on average (not counting use with activity) Cough interfering with sleep two time or less a month Oral steroids no more than once a year No hospitalizations  Allergic rhinitis - Continue Xyzal 5mg  daily during the week.  If needed can take extra dose in the AM.   - Use nasal Atrovent 1-2 sprays each nostril 1-2 times a day for nasal congestion or drainage - Use nasal saline spray daily to help decrease risk of nosebleeds - Continue montelukast 10 mg daily - Use Cromolyn 1-2 drop each eye daily up to 3-4 times a day as needed for itchy/watery eyes. - Continue to use dust covers for dust mite control  Food allergies  - Continue to avoid all shellfish - Have access to your EpiPen at all times, and follow your emergency action plan.  Seborrheic dermatitis - recommend use ketoconzole shampoo 1-2 times a week - try to avoid heavy oiling of the scalp   Follow up in 6 months or sooner if needed.

## 2021-12-30 NOTE — Progress Notes (Signed)
Follow-up Note  RE: Lauren Cain MRN: [id number] DOB: 2001/02/10 Date of Office Visit: 12/30/2021   History of present illness: Lauren Cain is a 20 y.o. female presenting today for follow-up of asthma, allergic rhinitis, food allergies.  She presents today with her mother.  She was last seen in the office on 06/15/21 by myself.    She states her asthma has been acting up with coughing, wheezing, shortness of breath and chest tightness for past week.  She also states the coughing is productive with green phlegm.  She has been using albuterol about twice a day for past week with relief of symptoms temporarily.  She feels like her asthma has been acting up with the colder weather.  She does budesonide with formoterol daily via nebulizer daily.  Since last visit has not had any ED/UC visit or systemic steroid needs.   She has been having runny nose as well lately as well as nose bleeds. She also reports itchy/watery eyes.  She does not have nasal atrovent at this time.  She doe still take xyzal and singulair daily.   She is avoiding shellfish.  She does not have an epipen at this time as mother states it was on backorder at the pharmacy.  Mother also states she has been having a lot of flaking around her hairline and ears.  They wash hair 1-2 times a week.  She does apply oils like tea tree oil and similar oils to scalp.   Review of systems: Review of Systems  Constitutional: Negative.   HENT:  Positive for nosebleeds and postnasal drip.   Eyes: Negative.   Respiratory:  Positive for cough, chest tightness, shortness of breath and wheezing.   Cardiovascular: Negative.   Gastrointestinal: Negative.   Musculoskeletal: Negative.   Skin: Negative.   Allergic/Immunologic: Negative.   Neurological: Negative.      All other systems negative unless noted above in HPI  Past medical/social/surgical/family history have been reviewed and are unchanged unless specifically indicated below.  No  changes  Medication List: Current Outpatient Medications  Medication Sig Dispense Refill   Ascorbic Acid (VITAMIN C PO) Take by mouth daily.     calcium carbonate (OS-CAL) 600 MG tablet TAKE 1 TABLET BY MOUTH EVERY DAY 30 tablet 2   Cholecalciferol (VITAMIN D) 50 MCG (2000 UT) tablet Take 1 tablet (2,000 Units total) by mouth daily. 30 tablet 2   clotrimazole (LOTRIMIN) 1 % cream Apply 1 application topically 2 (two) times daily. To foot 30 g 0   cromolyn (OPTICROM) 4 % ophthalmic solution 1-2 drop each eye daily up to 3-4 times a day as needed for itchy/watery eyes 10 mL 5   diphenhydrAMINE (BENADRYL) 12.5 MG/5ML elixir TAKE ONE TIME AS NEEDED FOR UP TO 1 DOSE FOR ALLERGIES CAN GIVE 2ND DOSE AFTER 15 MINUTES)     Etonogestrel (NEXPLANON [place]) Inject into the skin.     [START ON 01/02/2022] ketoconazole (NIZORAL) 2 % shampoo 1 Application 1-2 times a week 120 mL 5   Multiple Vitamin (MULTIVITAMIN ADULT) TABS Take 1 tablet by mouth daily. 90 tablet 1   norethindrone (AYGESTIN) 5 MG tablet Take 1 tablet (5 mg total) by mouth daily. 30 tablet 2   polyethylene glycol (MIRALAX / GLYCOLAX) 17 g packet Take 17 g by mouth daily. (Patient taking differently: Take 17 g by mouth as needed for mild constipation or moderate constipation.) 14 each 3   predniSONE (DELTASONE) 10 MG tablet Take 3 tablets (30  mg total) by mouth daily for 5 days. 15 tablet 0   Respiratory Therapy Supplies (NEBULIZER MASK ADULT) MISC Take 1 each by nebulization as directed. 1 each 1   risperiDONE (RISPERDAL) 1 MG tablet TAKE 1 TABLET EVERY DAY 31 tablet 2   sertraline (ZOLOFT) 50 MG tablet TAKE 1 TABLET BY MOUTH DAILY WITH DINNER FOR PTSD     Spacer/Aero Chamber Mouthpiece MISC Use as directed. 1 each 0   albuterol (PROVENTIL) (2.5 MG/3ML) 0.083% nebulizer solution USE 1 VIAL IN NEBULIZER EVERY 4 HOURS AS NEEDED FOR WHEEZING/SHORTNESS OF BREATH 75 mL 5   albuterol (VENTOLIN HFA) 108 (90 Base) MCG/ACT inhaler Inhale 2 puffs into  the lungs every 6 (six) hours as needed for wheezing or shortness of breath. 2 each 2   budesonide (PULMICORT) 0.5 MG/2ML nebulizer solution Take 2 mLs (0.5 mg total) by nebulization 2 (two) times daily. Take 1 vial twice a day mixed with Formoterol nebulized 120 mL 0   EPINEPHrine 0.3 mg/0.3 mL IJ SOAJ injection Inject 0.3 mg into the muscle as needed for anaphylaxis. 2 each 1   formoterol (PERFOROMIST) 20 MCG/2ML nebulizer solution Take 2 mLs (20 mcg total) by nebulization 2 (two) times daily. Mix Budesonide vial with Formoterol vial in nebulizer twice a day. Do not use Formoterol alone. 120 mL 5   ipratropium (ATROVENT) 0.06 % nasal spray 1-2 sprays each nostril 1-2 times a day for nasal congestion or drainage 15 mL 5   levocetirizine (XYZAL) 5 MG tablet Take 1 tablet (5 mg total) by mouth every evening. 30 tablet 5   mometasone (NASONEX) 50 MCG/ACT nasal spray Place 2 sprays into the nose daily. (Patient not taking: Reported on 12/30/2021) 17 g 5   montelukast (SINGULAIR) 10 MG tablet Take 1 tablet (10 mg total) by mouth at bedtime. 30 tablet 5   No current facility-administered medications for this visit.     Known medication allergies: Allergies  Allergen Reactions   Shellfish-Derived Products Swelling   Cats Claw [Uncaria Tomentosa (Cats Claw)]    Dust Mite Extract    Other     TREES   Shellfish Allergy    Tree Extract      Physical examination: Blood pressure 114/60, pulse 88, temperature 98.3 F (36.8 C), resp. rate 18, height 5\' 3"  (1.6 m), weight 125 lb (56.7 kg), SpO2 98 %.  General: Alert, interactive, in no acute distress. HEENT: PERRLA, TMs pearly gray, turbinates mildly edematous without discharge, post-pharynx non erythematous. Neck: Supple without lymphadenopathy. Lungs: Mildly decreased breath sounds bilaterally without wheezing, rhonchi or rales. {no increased work of breathing. CV: Normal S1, S2 without murmurs. Abdomen: Nondistended, nontender. Skin: Warm and  dry, without lesions or rashes. Extremities:  No clubbing, cyanosis or edema. Neuro:   Grossly intact.  Diagnositics/Labs:  Spirometry: FEV1: 1.89L 67%, FVC: 2.16L 68% predicted.  This is lower than previous visit testing  Assessment and plan: Asthma with current flare - use Budesonide 0.5mg  mixed with Formoterol nebulized twice daily (in morning and at night).  - Use albuterol rescue inhaler or nebulizer as needed, up to 2 puffs every 4-6 hours during an asthma exacerbation.  - Recommend using albuterol prior to activity/exercise.  Use the albuterol pump inhaler with spacer.  Use 2 puffs 15-20 minutes prior to exercise/activity  - Take prednisone as directed.  Sending prescription to pharmacy - Let us know if you are not meeting the following goals.   Asthma control goals:  Full participation in all desired activities (  may need albuterol before activity) Albuterol use two time or less a week on average (not counting use with activity) Cough interfering with sleep two time or less a month Oral steroids no more than once a year No hospitalizations  Allergic rhinitis - Continue Xyzal 5mg  daily during the week.  If needed can take extra dose in the AM.   - Use nasal Atrovent 1-2 sprays each nostril 1-2 times a day for nasal congestion or drainage - Use nasal saline spray daily to help decrease risk of nosebleeds - Continue montelukast 10 mg daily - Use Cromolyn 1-2 drop each eye daily up to 3-4 times a day as needed for itchy/watery eyes. - Continue to use dust covers for dust mite control  Food allergies  - Continue to avoid all shellfish - Have access to your EpiPen at all times, and follow your emergency action plan.  Seborrheic dermatitis - recommend use ketoconzole shampoo 1-2 times a week - try to avoid heavy oiling of the scalp   Follow up in 6 months or sooner if needed.    I appreciate the opportunity to take part in Kaniesha's care. Please do not hesitate to contact me  with questions.  Sincerely,   , MD Allergy/Immunology [facility]

## 2022-01-10 ENCOUNTER — Other Ambulatory Visit (HOSPITAL_COMMUNITY): Payer: Self-pay

## 2022-01-17 ENCOUNTER — Ambulatory Visit: Payer: Medicaid Other | Admitting: Physician Assistant

## 2022-01-28 ENCOUNTER — Emergency Department (HOSPITAL_BASED_OUTPATIENT_CLINIC_OR_DEPARTMENT_OTHER)
Admission: EM | Admit: 2022-01-28 | Discharge: 2022-01-28 | Disposition: A | Discharge status: Home / Self Care | Payer: Medicaid Other | Attending: Emergency Medicine | Admitting: Emergency Medicine

## 2022-01-28 ENCOUNTER — Emergency Department (HOSPITAL_BASED_OUTPATIENT_CLINIC_OR_DEPARTMENT_OTHER): Payer: Medicaid Other

## 2022-01-28 ENCOUNTER — Other Ambulatory Visit: Payer: Self-pay

## 2022-01-28 ENCOUNTER — Encounter (HOSPITAL_BASED_OUTPATIENT_CLINIC_OR_DEPARTMENT_OTHER): Payer: Self-pay | Admitting: Emergency Medicine

## 2022-01-28 DIAGNOSIS — J45909 Unspecified asthma, uncomplicated: Secondary | ICD-10-CM | POA: Insufficient documentation

## 2022-01-28 DIAGNOSIS — U071 COVID-19: Secondary | ICD-10-CM | POA: Diagnosis not present

## 2022-01-28 DIAGNOSIS — R059 Cough, unspecified: Secondary | ICD-10-CM | POA: Diagnosis present

## 2022-01-28 LAB — RESP PANEL BY RT-PCR (RSV, FLU A&B, COVID)  RVPGX2
Influenza A by PCR: NEGATIVE
Influenza B by PCR: NEGATIVE
Resp Syncytial Virus by PCR: NEGATIVE
SARS Coronavirus 2 by RT PCR: POSITIVE — AB

## 2022-01-28 LAB — GROUP A STREP BY PCR: Group A Strep by PCR: NOT DETECTED

## 2022-01-28 MED ORDER — IPRATROPIUM-ALBUTEROL 0.5-2.5 (3) MG/3ML IN SOLN
3.0000 mL | Freq: Once | RESPIRATORY_TRACT | Status: AC
  Administered 2022-01-28: 3 mL via RESPIRATORY_TRACT
  Filled 2022-01-28: qty 3

## 2022-01-28 MED ORDER — BENZONATATE 100 MG PO CAPS: 100.0000 mg | ORAL_CAPSULE | Freq: Three times a day (TID) | ORAL | 0 refills | Status: DC

## 2022-01-28 NOTE — ED Provider Notes (Signed)
[facility] EMERGENCY DEPT Provider Note   CSN: [id number] Arrival date & time: 01/28/22  1840     History  Chief Complaint  Patient presents with   Cough    Lauren Cain is a 21 y.o. female.  Patient with history of asthma presents today with complaints of cough and congestion. States that same has been ongoing for the past 2 days. No known sick contacts. Has been using her home inhaler with some relief.  Denies fevers, chills, chest pain, shortness of breath, nausea, vomiting, diarrhea, or abdominal pain.  The history is provided by the patient. No language interpreter was used.  Cough      Home Medications Prior to Admission medications   Medication Sig Start Date End Date Taking? Authorizing Provider  albuterol (PROVENTIL) (2.5 MG/3ML) 0.083% nebulizer solution USE 1 VIAL IN NEBULIZER EVERY 4 HOURS AS NEEDED FOR WHEEZING/SHORTNESS OF BREATH 12/30/21   Marcelyn Bruins, MD  albuterol (VENTOLIN HFA) 108 (90 Base) MCG/ACT inhaler Inhale 2 puffs into the lungs every 6 (six) hours as needed for wheezing or shortness of breath. 12/30/21   Marcelyn Bruins, MD  Ascorbic Acid (VITAMIN C PO) Take by mouth daily.    [provider]  budesonide (PULMICORT) 0.5 MG/2ML nebulizer solution Take 2 mLs (0.5 mg total) by nebulization 2 (two) times daily. Take 1 vial twice a day mixed with Formoterol nebulized 12/30/21   Marcelyn Bruins, MD  calcium carbonate (OS-CAL) 600 MG tablet TAKE 1 TABLET BY MOUTH EVERY DAY 08/20/21   Alfonso Ramus T, FNP  Cholecalciferol (VITAMIN D) 50 MCG (2000 UT) tablet Take 1 tablet (2,000 Units total) by mouth daily. 03/28/21   Georges Mouse, NP  cromolyn (OPTICROM) 4 % ophthalmic solution 1-2 drop each eye daily up to 3-4 times a day as needed for itchy/watery eyes 12/30/21   Marcelyn Bruins, MD  diphenhydrAMINE (BENADRYL) 12.5 MG/5ML elixir TAKE ONE TIME AS NEEDED FOR UP TO 1 DOSE FOR ALLERGIES  CAN GIVE 2ND DOSE AFTER 15 MINUTES) 08/09/15   [provider]  EPINEPHrine 0.3 mg/0.3 mL IJ SOAJ injection Inject 0.3 mg into the muscle as needed for anaphylaxis. 12/30/21   Marcelyn Bruins, MD  Etonogestrel [facility]) Inject into the skin.    [provider]  formoterol (PERFOROMIST) 20 MCG/2ML nebulizer solution Take 2 mLs (20 mcg total) by nebulization 2 (two) times daily. Mix Budesonide vial with Formoterol vial in nebulizer twice a day. Do not use Formoterol alone. 12/30/21   Marcelyn Bruins, MD  ipratropium (ATROVENT) 0.06 % nasal spray 1-2 sprays each nostril 1-2 times a day for nasal congestion or drainage 12/30/21   Marcelyn Bruins, MD  ketoconazole (NIZORAL) 2 % shampoo 1 Application 1-2 times a week 01/02/22   Marcelyn Bruins, MD  levocetirizine (XYZAL) 5 MG tablet Take 1 tablet (5 mg total) by mouth every evening. 12/30/21   Marcelyn Bruins, MD  mometasone (NASONEX) 50 MCG/ACT nasal spray Place 2 sprays into the nose daily. Patient not taking: Reported on 12/30/2021 09/10/19   Marcelyn Bruins, MD  montelukast (SINGULAIR) 10 MG tablet Take 1 tablet (10 mg total) by mouth at bedtime. 12/30/21   Marcelyn Bruins, MD  Multiple Vitamin (MULTIVITAMIN ADULT) TABS Take 1 tablet by mouth daily. 10/18/21   Allwardt, Crist Infante, PA-C  norethindrone (AYGESTIN) 5 MG tablet Take 1 tablet (5 mg total) by mouth daily. 10/14/21 01/12/22  Georges Mouse, NP  polyethylene glycol (  MIRALAX / GLYCOLAX) 17 g packet Take 17 g by mouth daily. Patient taking differently: Take 17 g by mouth as needed for mild constipation or moderate constipation. 10/18/21   Allwardt, Crist Infante, PA-C  Respiratory Therapy Supplies (NEBULIZER MASK ADULT) MISC Take 1 each by nebulization as directed. 01/28/21   Marcelyn Bruins, MD  risperiDONE (RISPERDAL) 1 MG tablet TAKE 1 TABLET EVERY DAY 12/04/18   Leatha Gilding, MD  sertraline (ZOLOFT) 50  MG tablet TAKE 1 TABLET BY MOUTH DAILY WITH DINNER FOR PTSD 05/09/20   [provider]  Spacer/Aero Chamber Mouthpiece MISC Use as directed. 12/22/16   Marcelyn Bruins, MD      Allergies    Shellfish-derived products, Cats claw Montez Hageman tomentosa (cats claw)], Dust mite extract, Other, Shellfish allergy, and Tree extract    Review of Systems   Review of Systems  HENT:  Positive for congestion.   Respiratory:  Positive for cough.   All other systems reviewed and are negative.   Physical Exam Updated Vital Signs BP 104/66   Pulse 99   Temp 98.9 F (37.2 C) (Oral)   Resp 18   SpO2 100%  Physical Exam Vitals and nursing note reviewed.  Constitutional:      General: She is not in acute distress.    Appearance: Normal appearance. She is normal weight. She is not ill-appearing, toxic-appearing or diaphoretic.  HENT:     Head: Normocephalic and atraumatic.     Mouth/Throat:     Mouth: Mucous membranes are moist.     Pharynx: Oropharynx is clear. Uvula midline.     Tonsils: No tonsillar exudate or tonsillar abscesses.  Eyes:     Pupils: Pupils are equal, round, and reactive to light.  Cardiovascular:     Rate and Rhythm: Normal rate and regular rhythm.     Heart sounds: Normal heart sounds.  Pulmonary:     Effort: Pulmonary effort is normal. No respiratory distress.     Breath sounds: Normal breath sounds.  Abdominal:     General: Abdomen is flat.     Palpations: Abdomen is soft.  Musculoskeletal:        General: Normal range of motion.     Cervical back: Normal range of motion and neck supple. No tenderness.  Lymphadenopathy:     Cervical: No cervical adenopathy.  Skin:    General: Skin is warm and dry.  Neurological:     General: No focal deficit present.     Mental Status: She is alert.  Psychiatric:        Mood and Affect: Mood normal.        Behavior: Behavior normal.     ED Results / Procedures / Treatments   Labs (all labs ordered are  listed, but only abnormal results are displayed) Labs Reviewed  RESP PANEL BY RT-PCR (RSV, FLU A&B, COVID)  RVPGX2 - Abnormal; Notable for the following components:      Result Value   SARS Coronavirus 2 by RT PCR POSITIVE (*)    All other components within normal limits  GROUP A STREP BY PCR    EKG None  Radiology DG Chest Portable 1 View  Result Date: 01/28/2022 CLINICAL DATA:  COVID positive EXAM: PORTABLE CHEST 1 VIEW COMPARISON:  None Available. FINDINGS: Normal cardiomediastinal silhouette. Questionable infiltrates in the left lower lung. The lungs are otherwise clear. No pleural effusion or pneumothorax. No acute osseous abnormality. IMPRESSION: Possible subtle infiltrates versus atelectasis in the left lower  lung. Electronically Signed   By: Minerva Fester M.D.   On: 01/28/2022 22:07    Procedures Procedures    Medications Ordered in ED Medications  ipratropium-albuterol (DUONEB) 0.5-2.5 (3) MG/3ML nebulizer solution 3 mL (3 mLs Nebulization Given 01/28/22 2221)    ED Course/ Medical Decision Making/ A&P                           Medical Decision Making Amount and/or Complexity of Data Reviewed Radiology: ordered.  Risk Prescription drug management.   Patient presents today with 2 days of cough and congestion.  She is afebrile, nontoxic-appearing, and in no acute distress with reassuring vital signs.  Patient tested positive for COVID today.  I have discussed this with the patient and her family. Discussed that antibiotics are not indicated for viral infections.  Chest x-ray obtained which reveals possible subtle infiltrate versus atelectasis.  I have personally reviewed and interpreted this imaging and agree with radiology interpretation.  Patient also given a DuoNeb per family's request with symptomatic improvement.  Pt will be discharged with Tessalon for symptomatic treatment.  I have also given information about over-the-counter recommendations.  Patient and family  verbalizes understanding and is agreeable with plan.  Educated on red flag symptoms that would prompt immediate return.  Also emphasized importance of close primary care follow-up.  Pt is hemodynamically stable & in NAD prior to dc.   Final Clinical Impression(s) / ED Diagnoses Final diagnoses:  COVID    Rx / DC Orders ED Discharge Orders          Ordered    benzonatate (TESSALON) 100 MG capsule  Every 8 hours        01/28/22 2241          An After Visit Summary was printed and given to the patient.     Vear Clock 01/28/22 2241    Rondel Baton, MD 01/30/22 [phone]

## 2022-01-28 NOTE — ED Triage Notes (Signed)
Cough sore throat chest tightness x 2 days Recently finished prednisone/ Taking mucinex an inhaler

## 2022-01-28 NOTE — Discharge Instructions (Signed)
You tested positive for COVID today.  As this is a viral illness, no antibiotics are indicated. I recommend that you get plenty of rest and focus on symptomatic relief which includes Cepacol throat lozenges for sore throat, Mucinex D (orange box) which you can get from behind the counter at your local pharmacy for congestion, and tylenol/ibuprofen as needed for fevers and bodyaches. I have also given you a prescription for Tessalon which is a cough suppressant medication for you to take as prescribed as needed for management of your symptoms. I also recommend:  Increased fluid intake. Sports drinks offer valuable electrolytes, sugars, and fluids.  Breathing heated mist or steam (vaporizer or shower).  Eating chicken soup or other clear broths, and maintaining good nutrition.   Increasing usage of your inhaler if you have asthma.  Return to work when your temperature has returned to normal.  Gargle warm salt water and spit it out for sore throat. Take benadryl or Zyrtec to decrease sinus secretions.  Follow Up: Follow up with your primary care doctor in 5-7 days for recheck of ongoing symptoms.  Return to emergency department for emergent changing or worsening of symptoms.  

## 2022-02-27 ENCOUNTER — Ambulatory Visit (INDEPENDENT_AMBULATORY_CARE_PROVIDER_SITE_OTHER)
Admission: RE | Admit: 2022-02-27 | Discharge: 2022-02-27 | Disposition: A | Discharge status: Home / Self Care | Payer: Medicaid Other | Source: Ambulatory Visit | Attending: Physician Assistant | Admitting: Physician Assistant

## 2022-02-27 ENCOUNTER — Encounter: Payer: Self-pay | Admitting: Physician Assistant

## 2022-02-27 ENCOUNTER — Ambulatory Visit (INDEPENDENT_AMBULATORY_CARE_PROVIDER_SITE_OTHER): Payer: Medicaid Other | Admitting: Physician Assistant

## 2022-02-27 VITALS — BP 102/68 | HR 100 | Temp 97.7°F | Ht 63.0 in | Wt 126.2 lb

## 2022-02-27 DIAGNOSIS — Z975 Presence of (intrauterine) contraceptive device: Secondary | ICD-10-CM

## 2022-02-27 DIAGNOSIS — R9389 Abnormal findings on diagnostic imaging of other specified body structures: Secondary | ICD-10-CM | POA: Diagnosis not present

## 2022-02-27 DIAGNOSIS — Z5982 Transportation insecurity: Secondary | ICD-10-CM

## 2022-02-27 DIAGNOSIS — N946 Dysmenorrhea, unspecified: Secondary | ICD-10-CM

## 2022-02-27 DIAGNOSIS — N921 Excessive and frequent menstruation with irregular cycle: Secondary | ICD-10-CM

## 2022-02-27 DIAGNOSIS — M859 Disorder of bone density and structure, unspecified: Secondary | ICD-10-CM | POA: Diagnosis not present

## 2022-02-27 DIAGNOSIS — R634 Abnormal weight loss: Secondary | ICD-10-CM | POA: Diagnosis not present

## 2022-02-27 DIAGNOSIS — M810 Age-related osteoporosis without current pathological fracture: Secondary | ICD-10-CM

## 2022-02-27 MED ORDER — CALCIUM 600 MG PO TABS: 1.0000 | ORAL_TABLET | Freq: Every day | ORAL | 2 refills | Status: DC

## 2022-02-27 MED ORDER — NORETHINDRONE ACETATE 5 MG PO TABS: 5.0000 mg | ORAL_TABLET | Freq: Every day | ORAL | 2 refills | Status: DC

## 2022-02-27 NOTE — Progress Notes (Unsigned)
Subjective:    Patient ID: Lauren Cain, female    DOB: 2000/07/30, 22 y.o.   MRN: [id number]  Chief Complaint  Patient presents with   Follow-up    Pt in office for follow up on last appt as new patient; pt has been doing well been having a hard time getting in touch with someone to hlp with day services for pt, pt needing refills on Calcium Rx or check levels to see if still needing to take; pt also c/o right index finger has skin irritation that came up; pt had Covid 5 weeks ago still has lingering cough    HPI Patient is in today for follow-up. Here with mom.   COVID-19 positive about 5 weeks ago. She was seen at ED on 01/28/22. Still has lingering cough, but overall feeling better. Also developed a few spots on R finger that are itching, started around same time as COVID-19.   Hoping to get into a day program. Pt is limited by transportation as mom works during day and pt does not drive.   Past Medical History:  Diagnosis Date   Abnormal weight loss    Asthma    Dysmenorrhea    Eczema    Mental impairment    Mitral valve prolapse    URI (upper respiratory infection)     Past Surgical History:  Procedure Laterality Date   MOUTH SURGERY     x 2   TEAR DUCT PROBING Bilateral    TOENAIL EXCISION     removal    Family History  Problem Relation Age of Onset   Asthma Mother    Allergic rhinitis Mother    Obesity Mother    Allergic rhinitis Father     Social History   Tobacco Use   Smoking status: Never   Smokeless tobacco: Never  Vaping Use   Vaping Use: Never used  Substance Use Topics   Alcohol use: Not Currently   Drug use: Never     Allergies  Allergen Reactions   Shellfish-Derived Products Swelling   Cats Claw [Uncaria Tomentosa (Cats Claw)]    Dust Mite Extract    Other     TREES   Shellfish Allergy    Tree Extract     Review of Systems NEGATIVE UNLESS OTHERWISE INDICATED IN HPI      Objective:     BP 102/68 (BP Location: Left Arm)    Pulse 100   Temp 97.7 F (36.5 C) (Temporal)   Ht 5\' 3"  (1.6 m)   Wt 126 lb 3.2 oz (57.2 kg)   SpO2 100%   BMI 22.36 kg/m   Wt Readings from Last 3 Encounters:  02/27/22 126 lb 3.2 oz (57.2 kg)  12/30/21 125 lb (56.7 kg)  11/28/21 121 lb (54.9 kg)    BP Readings from Last 3 Encounters:  02/27/22 102/68  01/28/22 104/66  12/30/21 114/60     Physical Exam Vitals and nursing note reviewed.  Constitutional:      Appearance: Normal appearance. She is normal weight. She is not toxic-appearing.  HENT:     Head: Normocephalic and atraumatic.     Right Ear: Tympanic membrane, ear canal and external ear normal.     Left Ear: Tympanic membrane, ear canal and external ear normal.     Nose: Nose normal.     Mouth/Throat:     Mouth: Mucous membranes are moist.  Eyes:     Extraocular Movements: Extraocular movements intact.  Conjunctiva/sclera: Conjunctivae normal.     Pupils: Pupils are equal, round, and reactive to light.  Cardiovascular:     Rate and Rhythm: Normal rate and regular rhythm.     Pulses: Normal pulses.     Heart sounds: Murmur heard.  Pulmonary:     Effort: Pulmonary effort is normal.     Breath sounds: Normal breath sounds.  Abdominal:     General: Abdomen is flat. Bowel sounds are normal.     Palpations: Abdomen is soft.  Musculoskeletal:        General: Normal range of motion.     Cervical back: Normal range of motion and neck supple.  Skin:    General: Skin is warm and dry.  Neurological:     General: No focal deficit present.     Mental Status: She is alert and oriented to person, place, and time.  Psychiatric:        Mood and Affect: Mood normal.        Assessment & Plan:  Abnormal weight loss Assessment & Plan: Stable, no further weight loss at this time    Transportation insecurity Assessment & Plan: Referral for community resources   Orders: -     AMB Referral to Managed Medicaid Care Management  Abnormal chest x-ray -     DG  Chest 2 View; Future  Low bone density for age -     Ambulatory referral to Gynecology  Osteoporosis of multiple sites Assessment & Plan: Calcium carbonate 600 mg daily Vit D 2000 iu daily  Orders: -     Ambulatory referral to Gynecology  Dysmenorrhea -     Ambulatory referral to Gynecology  Breakthrough bleeding on Nexplanon -     Norethindrone Acetate; Take 1 tablet (5 mg total) by mouth daily.  Dispense: 30 tablet; Refill: 2  Other orders -     Calcium; Take 1 tablet (600 mg total) by mouth daily.  Dispense: 30 tablet; Refill: 2    CXR 01/28/22:  IMPRESSION: Possible subtle infiltrates versus atelectasis in the left lower lung. ---Will plan for repeat CXR at [facility]; treat pending abnormal results       Return in about 6 months (around 08/28/2022) for recheck .  This note was prepared with assistance of Systems analyst. Occasional wrong-word or sound-a-like substitutions may have occurred due to the inherent limitations of voice recognition software.     Jarin Cornfield M Latarsha Zani, PA-C

## 2022-02-27 NOTE — Patient Instructions (Addendum)
Weight is stable, good work!   Shelba Flake for repeat chest XRAY  Referral to GYN for birth control and bone density management  Referral for community resources  Prescriptions refilled.

## 2022-02-28 NOTE — Assessment & Plan Note (Signed)
Stable, no further weight loss at this time

## 2022-02-28 NOTE — Assessment & Plan Note (Signed)
Referral for community resources

## 2022-02-28 NOTE — Assessment & Plan Note (Signed)
Calcium carbonate 600 mg daily Vit D 2000 iu daily

## 2022-03-07 ENCOUNTER — Other Ambulatory Visit: Payer: Medicaid Other

## 2022-03-07 ENCOUNTER — Other Ambulatory Visit: Payer: Self-pay | Admitting: Allergy

## 2022-03-07 NOTE — Patient Instructions (Signed)
Visit Information  Thank you for taking time to visit with me today. Please don't hesitate to contact me if I can be of assistance to you before our next scheduled telephone appointment.   Mickel Fuchs, BSW, [place] Managed Medicaid Team  [phone]
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
## 2022-04-07 ENCOUNTER — Other Ambulatory Visit: Payer: Medicaid Other

## 2022-04-07 NOTE — Patient Instructions (Signed)
Thank you for taking time to speak with me today about care coordination and care management services available to you at no cost as part of your Medicaid benefit. These services are voluntary. Our team is available to provide assistance regarding your health care needs at any time. Please do not hesitate to reach out to me if we can be of service to you at any time in the future.   Mickel Fuchs, BSW, [place] Managed Medicaid Team  [phone]
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
## 2022-04-08 ENCOUNTER — Other Ambulatory Visit: Payer: Self-pay | Admitting: Allergy

## 2022-04-11 ENCOUNTER — Other Ambulatory Visit: Payer: Self-pay | Admitting: Allergy

## 2022-04-13 ENCOUNTER — Other Ambulatory Visit (HOSPITAL_COMMUNITY): Payer: Self-pay

## 2022-04-13 ENCOUNTER — Telehealth: Payer: Self-pay

## 2022-04-13 NOTE — Telephone Encounter (Signed)
Patient Advocate Encounter   Received notification from Medicaid that prior authorization is required for Formoterol Fumarate 20MCG/2ML nebulizer solution  Submitted: 04-13-2022 Key [id number]  Status is pending

## 2022-04-20 NOTE — Telephone Encounter (Signed)
Patient Advocate Encounter  Received a fax from [facility] regarding Prior Authorization for Formoterol Fumarate 20MCG/2ML nebulizer solution.  This medication has been DENIED.   Determination letter will be sent to patient.

## 2022-04-20 NOTE — Telephone Encounter (Signed)
Preferred nebulizer solution is the albuterol solutions. Medicaid does not give [place] any other information.

## 2022-04-21 NOTE — Telephone Encounter (Signed)
I put that she had "failed" the albuterol since she is using the formoterol for daily use and not prn.   It has now been APPROVED from 04/21/2022 - 04/21/2023  Confirmation: [id number]

## 2022-04-30 ENCOUNTER — Other Ambulatory Visit: Payer: Self-pay

## 2022-04-30 ENCOUNTER — Encounter (HOSPITAL_COMMUNITY): Payer: Self-pay

## 2022-04-30 ENCOUNTER — Emergency Department (HOSPITAL_COMMUNITY)
Admission: EM | Admit: 2022-04-30 | Discharge: 2022-05-01 | Disposition: A | Discharge status: Home / Self Care | Payer: Medicaid Other | Attending: Emergency Medicine | Admitting: Emergency Medicine

## 2022-04-30 DIAGNOSIS — F331 Major depressive disorder, recurrent, moderate: Secondary | ICD-10-CM | POA: Insufficient documentation

## 2022-04-30 DIAGNOSIS — R456 Violent behavior: Secondary | ICD-10-CM | POA: Diagnosis not present

## 2022-04-30 DIAGNOSIS — R4689 Other symptoms and signs involving appearance and behavior: Secondary | ICD-10-CM

## 2022-04-30 LAB — URINALYSIS, ROUTINE W REFLEX MICROSCOPIC
Bilirubin Urine: NEGATIVE
Glucose, UA: NEGATIVE mg/dL
Hgb urine dipstick: NEGATIVE
Ketones, ur: NEGATIVE mg/dL
Leukocytes,Ua: NEGATIVE
Nitrite: NEGATIVE
Protein, ur: NEGATIVE mg/dL
Specific Gravity, Urine: 1.014 (ref 1.005–1.030)
pH: 7 (ref 5.0–8.0)

## 2022-04-30 LAB — CBC WITH DIFFERENTIAL/PLATELET
Abs Immature Granulocytes: 0.04 10*3/uL (ref 0.00–0.07)
Basophils Absolute: 0.1 10*3/uL (ref 0.0–0.1)
Basophils Relative: 1 %
Eosinophils Absolute: 0.1 10*3/uL (ref 0.0–0.5)
Eosinophils Relative: 1 %
HCT: 41 % (ref 36.0–46.0)
Hemoglobin: 13.6 g/dL (ref 12.0–15.0)
Immature Granulocytes: 1 %
Lymphocytes Relative: 20 %
Lymphs Abs: 1.6 10*3/uL (ref 0.7–4.0)
MCH: 28.3 pg (ref 26.0–34.0)
MCHC: 33.2 g/dL (ref 30.0–36.0)
MCV: 85.2 fL (ref 80.0–100.0)
Monocytes Absolute: 0.8 10*3/uL (ref 0.1–1.0)
Monocytes Relative: 11 %
Neutro Abs: 5.1 10*3/uL (ref 1.7–7.7)
Neutrophils Relative %: 66 %
Platelets: 301 10*3/uL (ref 150–400)
RBC: 4.81 MIL/uL (ref 3.87–5.11)
RDW: 13.1 % (ref 11.5–15.5)
WBC: 7.6 10*3/uL (ref 4.0–10.5)
nRBC: 0 % (ref 0.0–0.2)

## 2022-04-30 LAB — RAPID URINE DRUG SCREEN, HOSP PERFORMED
Amphetamines: NOT DETECTED
Barbiturates: NOT DETECTED
Benzodiazepines: NOT DETECTED
Cocaine: NOT DETECTED
Opiates: NOT DETECTED
Tetrahydrocannabinol: NOT DETECTED

## 2022-04-30 LAB — COMPREHENSIVE METABOLIC PANEL
ALT: 15 U/L (ref 0–44)
AST: 18 U/L (ref 15–41)
Albumin: 4.1 g/dL (ref 3.5–5.0)
Alkaline Phosphatase: 74 U/L (ref 38–126)
Anion gap: 13 (ref 5–15)
BUN: 5 mg/dL — ABNORMAL LOW (ref 6–20)
CO2: 23 mmol/L (ref 22–32)
Calcium: 9.3 mg/dL (ref 8.9–10.3)
Chloride: 104 mmol/L (ref 98–111)
Creatinine, Ser: 0.59 mg/dL (ref 0.44–1.00)
GFR, Estimated: >60 mL/min (ref >60)
Glucose, Bld: 85 mg/dL (ref 70–99)
Potassium: 3.3 mmol/L — ABNORMAL LOW (ref 3.5–5.1)
Sodium: 140 mmol/L (ref 135–145)
Total Bilirubin: 1.1 mg/dL (ref 0.3–1.2)
Total Protein: 7.5 g/dL (ref 6.5–8.1)

## 2022-04-30 LAB — ETHANOL: Alcohol, Ethyl (B): <10 mg/dL (ref <10)

## 2022-04-30 LAB — I-STAT BETA HCG BLOOD, ED (MC, WL, AP ONLY): I-stat hCG, quantitative: <5 m[IU]/mL (ref <5)

## 2022-04-30 MED ORDER — POTASSIUM CHLORIDE CRYS ER 20 MEQ PO TBCR: 40.0000 meq | EXTENDED_RELEASE_TABLET | Freq: Once | ORAL | Status: DC

## 2022-04-30 NOTE — ED Notes (Signed)
Ivc paperwork attached to the clip board in purple zone given to nurse tiffany

## 2022-04-30 NOTE — ED Notes (Signed)
Patient walked to purple zone without issue. Patient dressed out, with mother taking possession of clothes and jewelery. Security requested in person to wand patient.
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
## 2022-04-30 NOTE — ED Provider Notes (Signed)
[facility] Provider Note   CSN: [id number] Arrival date & time: 04/30/22  1809     History No chief complaint on file.   Lauren Cain is a 22 y.o. female with a past medical history of unspecified mood disorder and intellectual disability presenting today with her legal guardian due to abnormal behavior.  Patient tells me that she was sitting in a restaurant and talking to her friend when all of a sudden she got agitated" beat herself up."  She tells me that she was annoyed that her friend had some chores that she did not complete.  She tells me that she then started to threaten to kill herself.  I asked her if she has an active plan of suicide and she tells me that she would go home and "tie herself up with a rope."  Denies AVH, drug or alcohol use.  When asked if she has thoughts or plans to hurt other people she says "I do not know yet."   Legal guardian corroborates this.  She says that she has had guardianship of the patient her entire life.  HPI     Home Medications Prior to Admission medications   Medication Sig Start Date End Date Taking? Authorizing Provider  albuterol (PROVENTIL) (2.5 MG/3ML) 0.083% nebulizer solution USE 1 VIAL IN NEBULIZER EVERY 4 HOURS AS NEEDED FOR WHEEZING/SHORTNESS OF BREATH 12/30/21   Kennith Gain, MD  albuterol (VENTOLIN HFA) 108 (90 Base) MCG/ACT inhaler Inhale 2 puffs into the lungs every 6 (six) hours as needed for wheezing or shortness of breath. 12/30/21   Kennith Gain, MD  Ascorbic Acid (VITAMIN C PO) Take by mouth daily.    [provider]  benzonatate (TESSALON) 100 MG capsule Take 1 capsule (100 mg total) by mouth every 8 (eight) hours. 01/28/22   Smoot, Sarah A, PA-C  budesonide (PULMICORT) 0.5 MG/2ML nebulizer solution TAKE 2 MLS (0.5 MG TOTAL) BY NEBULIZATION 2 (TWO) TIMES DAILY. TAKE 1 VIAL TWICE A DAY MIXED WITH FORMOTEROL NEBULIZED 03/07/22   Kennith Gain, MD  calcium carbonate (OS-CAL) 600 MG tablet Take 1 tablet (600 mg total) by mouth daily. 02/27/22   Allwardt, Randa Evens, PA-C  Cholecalciferol (VITAMIN D) 50 MCG (2000 UT) tablet Take 1 tablet (2,000 Units total) by mouth daily. 03/28/21   Parthenia Ames, NP  cromolyn (OPTICROM) 4 % ophthalmic solution 1-2 drop each eye daily up to 3-4 times a day as needed for itchy/watery eyes 12/30/21   Kennith Gain, MD  diphenhydrAMINE (BENADRYL) 12.5 MG/5ML elixir TAKE 20ML ONE TIME AS NEEDED FOR UP TO 1 DOSE FOR ALLERGIES CAN GIVE 2ND DOSE AFTER 15 MINUTES) 08/09/15   [provider]  EPINEPHrine 0.3 mg/0.3 mL IJ SOAJ injection Inject 0.3 mg into the muscle as needed for anaphylaxis. 12/30/21   Kennith Gain, MD  Etonogestrel [facility]) Inject into the skin.    [provider]  formoterol (PERFOROMIST) 20 MCG/2ML nebulizer solution TAKE 2 MLS (20 MCG TOTAL) BY NEBULIZATION 2 (TWO) TIMES DAILY. MIX BUDESONIDE VIAL WITH FORMOTEROL VIAL IN NEBULIZER TWICE A DAY. DO NOT USE FORMOTEROL ALONE. 04/11/22   Kennith Gain, MD  ipratropium (ATROVENT) 0.06 % nasal spray 1-2 sprays each nostril 1-2 times a day for nasal congestion or drainage 12/30/21   Kennith Gain, MD  ketoconazole (NIZORAL) 2 % shampoo 1 Application 1-2 times a week 01/02/22   Kennith Gain, MD  levocetirizine [facility])  5 MG tablet Take 1 tablet (5 mg total) by mouth every evening. 12/30/21   Kennith Gain, MD  mometasone (NASONEX) 50 MCG/ACT nasal spray Place 2 sprays into the nose daily. 09/10/19   Kennith Gain, MD  montelukast (SINGULAIR) 10 MG tablet Take 1 tablet (10 mg total) by mouth at bedtime. 12/30/21   Kennith Gain, MD  Multiple Vitamin (MULTIVITAMIN ADULT) TABS Take 1 tablet by mouth daily. 10/18/21   Allwardt, Randa Evens, PA-C  norethindrone (AYGESTIN) 5 MG tablet Take 1 tablet (5 mg total) by mouth daily. 02/27/22 05/28/22   Allwardt, Randa Evens, PA-C  polyethylene glycol (MIRALAX / GLYCOLAX) 17 g packet Take 17 g by mouth daily. Patient taking differently: Take 17 g by mouth as needed for mild constipation or moderate constipation. 10/18/21   Allwardt, Randa Evens, PA-C  Respiratory Therapy Supplies (NEBULIZER MASK ADULT) MISC Take 1 each by nebulization as directed. 01/28/21   Kennith Gain, MD  risperiDONE (RISPERDAL) 1 MG tablet TAKE 1 TABLET EVERY DAY 12/04/18   Gwynne Edinger, MD  sertraline (ZOLOFT) 50 MG tablet TAKE 1 TABLET BY MOUTH DAILY WITH DINNER FOR PTSD 05/09/20   [provider]  Spacer/Aero Chamber Mouthpiece MISC Use as directed. 12/22/16   Kennith Gain, MD      Allergies    Shellfish-derived products, Cats claw Angelica Ran tomentosa (cats claw)], Dust mite extract, Other, Shellfish allergy, and Tree extract    Review of Systems   Review of Systems  Physical Exam Updated Vital Signs There were no vitals taken for this visit. Physical Exam Vitals and nursing note reviewed.  Constitutional:      Appearance: Normal appearance.  HENT:     Head: Normocephalic and atraumatic.  Eyes:     General: No scleral icterus.    Conjunctiva/sclera: Conjunctivae normal.  Pulmonary:     Effort: Pulmonary effort is normal. No respiratory distress.  Musculoskeletal:     Comments: Full range of motion and no tenderness to the bilateral knees.  Small amounts of bruising just superior to the knees.  No lacerations.  No ankle, knee or hip tenderness  Skin:    Findings: No rash.  Neurological:     Mental Status: She is alert.  Psychiatric:        Mood and Affect: Mood normal.     ED Results / Procedures / Treatments   Labs (all labs ordered are listed, but only abnormal results are displayed) Labs Reviewed - No data to display  EKG None  Radiology No results found.  Procedures Procedures    Medications Ordered in ED Medications - No data to display  ED Course/  Medical Decision Making/ A&P Clinical Course as of 04/30/22 2222  Nancy Fetter Apr 30, 2022  2028 Per nursing request I went and spoke with the patient's legal guardian at bedside.  She says that she would like to take the patient home and have her PCP reevaluate her.  We discussed that I already filled out IVC paperwork that we agreed on when she first got here.  She is upset because she is not being asked to leave because visiting hours are over.  I double checked with nursing who says that visiting hours applied to legal guardians as well.  I reassured the renal guardian that I will keep her posted and then the psychiatric team would keep her abreast to the plan. [MR]    Clinical Course User Index [MR] Dorotea Hand A, PA-C  Medical Decision Making Amount and/or Complexity of Data Reviewed Labs: ordered.    22 year old female with a past medical history of mood disorder and intellectual disability presenting today for psych eval after expressing thoughts to kill herself.  She is with someone who identifies as her legal guardian.  Patient does not have a legal guardian with her and she is over 61.  As a safety precaution, I will pursue IVC until the patient can be cleared by the psych team.  Patient's lab work unremarkable.  Given a potassium tablet due to borderline potassium.  She is medically cleared at this time for psychiatric evaluation.  Psychiatric team should call the patient's mother/legal guardian with any updates to the care plan.   Final Clinical Impression(s) / ED Diagnoses Final diagnoses:  Aggressive behavior    Rx / DC Orders ED Discharge Orders     None         Helane Briceno, Cecilio Asper, PA-C 04/30/22 2224    Valarie Merino, MD 04/30/22 2329

## 2022-05-01 MED ORDER — OLOPATADINE HCL 0.1 % OP SOLN
1.0000 [drp] | Freq: Once | OPHTHALMIC | Status: AC
  Administered 2022-05-01: 1 [drp] via OPHTHALMIC
  Filled 2022-05-01: qty 5

## 2022-05-01 NOTE — ED Provider Notes (Signed)
  Provider Note MRN:  [id number]  Arrival date & time: 05/01/22    ED Course and Medical Decision Making  Assumed care from default provider at shift change.  Patient is medically cleared, had some abnormal behavior with some passive suicidality at home.  Long history of mood disorder, intellectual disability, history of similar outbursts in the past.  Patient's caregiver would like to take her home.  Patient's caregiver feels comfortable caring for her.  Patient cleared by TTS.  No indication for further testing or admission, appropriate for discharge.  IVC rescinded.  Procedures  Final Clinical Impressions(s) / ED Diagnoses     ICD-10-CM   1. Aggressive behavior  R46.89       ED Discharge Orders          Ordered    Release patient from involuntary commitment        05/01/22 0323              Discharge Instructions      You were evaluated in the Emergency Department and after careful evaluation, we did not find any emergent condition requiring admission or further testing in the hospital.  Your exam/testing today was overall reassuring.  Follow-up with outpatient mental health resources.  Please return to the Emergency Department if you experience any worsening of your condition.  Thank you for allowing [place] to be a part of your care.     Barth Kirks. Sedonia Small, [place] [email or web address]    Maudie Flakes, MD 05/01/22 [phone]

## 2022-05-01 NOTE — ED Notes (Signed)
Mother has arrived to pick up pt, requesting to speak with charge nurse- pt spoke with sister without mother's consent. Mother is legal guardian. Also requesting for provider to look at pt's eye, some redness noted.

## 2022-05-01 NOTE — ED Notes (Signed)
Pts mother able to pick the pt up around 8am.

## 2022-05-01 NOTE — Discharge Instructions (Addendum)
You were evaluated in the Emergency Department and after careful evaluation, we did not find any emergent condition requiring admission or further testing in the hospital.  Your exam/testing today was overall reassuring.  Follow-up with outpatient mental health resources.  Please return to the Emergency Department if you experience any worsening of your condition.  Thank you for allowing [place] to be a part of your care.   Outpatient psychiatric Services  Walk in hours for medication management Monday, Wednesday, Thursday, and Friday from 8:00 AM to 11:00 AM Recommend arriving by by 7:30 AM.  It is first come first serve.    Walk in hours for therapy intake Monday and Wednesday only 8:00 AM to 11:00 AM Encouraged to arrive by 7:30 AM.  It is first come first serve   Inpatient patient psychiatric services [facility] offers comprehensive behavioral heath care services for mental health and substance abuse treatment.  Social work can also assist with referral to or getting you into a rehabilitation program short or long term

## 2022-05-04 ENCOUNTER — Telehealth: Payer: Self-pay

## 2022-05-04 NOTE — Transitions of Care (Post Inpatient/ED Visit) (Signed)
   05/04/2022  Name: Lauren Cain: [id number] DOB: 04-Dec-2000  Today's TOC FU Call Status:    Transition Care Management Follow-up Telephone Call Date of Discharge: 04/30/22 Discharge [facility]) Type of Discharge: Emergency Department Reason for ED Visit: Mental or Mood Disorder Mental or Mood Disorder Diagnosis: Other Mental Health Disorder How have you been since you were released from the hospital?: Same Any questions or concerns?: No  Items Reviewed: Did you receive and understand the discharge instructions provided?: Yes Medications obtained and verified?: Yes (Medications Reviewed) Any new allergies since your discharge?: No Dietary orders reviewed?: NA Do you have support at home?: Yes People in Home: grandparent(s), parent(s) Name of Support/Comfort Primary Source: Mother, Lauren Cain and Lauren Cain, grandmother  Home Care and Equipment/Supplies: Were Home Health Services Ordered?: NA Any new equipment or medical supplies ordered?: NA  Functional Questionnaire: Do you need assistance with bathing/showering or dressing?: No Do you need assistance with meal preparation?: No Do you need assistance with eating?: No Do you have difficulty maintaining continence: No Do you need assistance with getting out of bed/getting out of a chair/moving?: No Do you have difficulty managing or taking your medications?: No  Follow up appointments reviewed: PCP Follow-up appointment confirmed?: Yes Date of PCP follow-up appointment?: 05/11/22 Follow-up Provider: Theresa Cain, [facility] Follow-up appointment confirmed?: NA Do you need transportation to your follow-up appointment?: No Do you understand care options if your condition(s) worsen?: Yes-patient verbalized understanding    [place], CMA

## 2022-05-04 NOTE — Telephone Encounter (Signed)
Noted and agreed, thank you. 

## 2022-05-11 ENCOUNTER — Ambulatory Visit: Payer: Medicaid Other | Admitting: Physician Assistant

## 2022-05-22 ENCOUNTER — Ambulatory Visit: Payer: Medicaid Other | Admitting: Physician Assistant

## 2022-06-05 ENCOUNTER — Encounter: Payer: Self-pay | Admitting: Physician Assistant

## 2022-06-05 ENCOUNTER — Ambulatory Visit (INDEPENDENT_AMBULATORY_CARE_PROVIDER_SITE_OTHER): Payer: Medicaid Other | Admitting: Physician Assistant

## 2022-06-05 VITALS — BP 112/72 | HR 94 | Temp 97.7°F | Ht 63.0 in | Wt 131.2 lb

## 2022-06-05 DIAGNOSIS — F39 Unspecified mood [affective] disorder: Secondary | ICD-10-CM | POA: Diagnosis not present

## 2022-06-05 DIAGNOSIS — R350 Frequency of micturition: Secondary | ICD-10-CM | POA: Diagnosis not present

## 2022-06-05 DIAGNOSIS — F79 Unspecified intellectual disabilities: Secondary | ICD-10-CM

## 2022-06-05 DIAGNOSIS — E876 Hypokalemia: Secondary | ICD-10-CM | POA: Diagnosis not present

## 2022-06-05 LAB — POC URINALSYSI DIPSTICK (AUTOMATED)
Bilirubin, UA: NEGATIVE
Blood, UA: NEGATIVE
Glucose, UA: NEGATIVE
Ketones, UA: NEGATIVE
Leukocytes, UA: NEGATIVE
Nitrite, UA: NEGATIVE
Protein, UA: NEGATIVE
Spec Grav, UA: <=1.005 — AB (ref 1.010–1.025)
Urobilinogen, UA: 0.2 E.U./dL
pH, UA: 7 (ref 5.0–8.0)

## 2022-06-05 NOTE — Progress Notes (Unsigned)
Subjective:    Patient ID: Lauren Cain, female    DOB: [birth year], 22 y.o.   MRN: [id number]  Chief Complaint  Patient presents with   Follow-up    Pt in office for Ed follow up; pt seems to being better; not having outbursts; pt c/o lower back pain; pt may have UTI as well, urinary frequency; no pain when urinating;     HPI Patient is in today for ED f/up on 04/30/22.   Recent loss of mom's father; she thinks this triggered the aggressive behavior.  Follows with [facility] Will be starting with [facility] soon for counseling and assessments.   She is taking Zoloft and Risperdal daily.   Sometimes has moments where she's crying, but overall doing better.  Past Medical History:  Diagnosis Date   Abnormal weight loss    Asthma    Dysmenorrhea    Eczema    Mental impairment    Mitral valve prolapse    URI (upper respiratory infection)     Past Surgical History:  Procedure Laterality Date   MOUTH SURGERY     x 2   TEAR DUCT PROBING Bilateral    TOENAIL EXCISION     removal    Family History  Problem Relation Age of Onset   Asthma Mother    Allergic rhinitis Mother    Obesity Mother    Allergic rhinitis Father    Prostate cancer Maternal Grandfather    Kidney disease Maternal Grandfather    Atrial fibrillation Maternal Grandfather     Social History   Tobacco Use   Smoking status: Never   Smokeless tobacco: Never  Vaping Use   Vaping Use: Never used  Substance Use Topics   Alcohol use: Not Currently   Drug use: Never     Allergies  Allergen Reactions   Shellfish-Derived Products Swelling   Cats Claw [Uncaria Tomentosa (Cats Claw)]    Dust Mite Extract    Other     TREES   Shellfish Allergy    Tree Extract     Review of Systems NEGATIVE UNLESS OTHERWISE INDICATED IN HPI      Objective:     BP 112/72 (BP Location: Left Arm)   Pulse 94   Temp 97.7 F (36.5 C) (Temporal)   Ht  (1.6 m)   Wt 131 lb 3.2 oz  (59.5 kg)   SpO2 98%   BMI 23.24 kg/m   Wt Readings from Last 3 Encounters:  06/05/22 131 lb 3.2 oz (59.5 kg)  02/27/22 126 lb 3.2 oz (57.2 kg)  12/30/21 125 lb (56.7 kg)    BP Readings from Last 3 Encounters:  06/05/22 112/72  05/01/22 123/84  02/27/22 102/68     Physical Exam Vitals and nursing note reviewed.  Constitutional:      Appearance: Normal appearance. She is normal weight. She is not toxic-appearing.  HENT:     Head: Normocephalic and atraumatic.     Right Ear: Tympanic membrane, ear canal and external ear normal.     Left Ear: Tympanic membrane, ear canal and external ear normal.     Nose: Nose normal.     Mouth/Throat:     Mouth: Mucous membranes are moist.  Eyes:     Extraocular Movements: Extraocular movements intact.     Conjunctiva/sclera: Conjunctivae normal.     Pupils: Pupils are equal, round, and reactive to light.  Cardiovascular:     Rate and Rhythm: Normal rate and regular  rhythm.     Pulses: Normal pulses.     Heart sounds: Murmur heard.  Pulmonary:     Effort: Pulmonary effort is normal.     Breath sounds: Normal breath sounds.  Abdominal:     General: Abdomen is flat. Bowel sounds are normal.     Palpations: Abdomen is soft.  Musculoskeletal:        General: Normal range of motion.     Cervical back: Normal range of motion and neck supple.  Skin:    General: Skin is warm and dry.  Neurological:     General: No focal deficit present.     Mental Status: She is alert and oriented to person, place, and time.  Psychiatric:        Mood and Affect: Mood normal.        Assessment & Plan:  Mood disorder  Intellectual disability  Urinary frequency -     POCT Urinalysis Dipstick (Automated)  Hypokalemia -     Potassium        No follow-ups on file.  This note was prepared with assistance of Conservation officer, historic buildings. Occasional wrong-word or sound-a-like substitutions may have occurred due to the inherent limitations  of voice recognition software.  Time Spent: *** minutes of total time was spent on the date of the encounter performing the following actions: chart review prior to seeing the patient, obtaining history, performing a medically necessary exam, counseling on the treatment plan, placing orders, and documenting in our EHR.       Islah Eve M Malerie Eakins, PA-C

## 2022-06-06 NOTE — Assessment & Plan Note (Signed)
Follows with psych Doing better since recent ED visit Continues Risperdal 0.5 mg at bedtime & Zoloft 50 mg daily

## 2022-06-06 NOTE — Assessment & Plan Note (Signed)
Follows with psych Mom looking into day programs for adults

## 2022-06-09 ENCOUNTER — Other Ambulatory Visit (INDEPENDENT_AMBULATORY_CARE_PROVIDER_SITE_OTHER): Payer: Medicaid Other

## 2022-06-09 ENCOUNTER — Other Ambulatory Visit: Payer: Self-pay

## 2022-06-09 DIAGNOSIS — E876 Hypokalemia: Secondary | ICD-10-CM

## 2022-06-09 LAB — POTASSIUM: Potassium: 3.9 mEq/L (ref 3.5–5.1)

## 2022-06-19 ENCOUNTER — Encounter: Payer: Self-pay | Admitting: Physician Assistant

## 2022-06-19 ENCOUNTER — Ambulatory Visit (INDEPENDENT_AMBULATORY_CARE_PROVIDER_SITE_OTHER): Payer: Medicaid Other | Admitting: Physician Assistant

## 2022-06-19 VITALS — BP 110/74 | HR 90 | Temp 98.0°F | Ht 63.0 in | Wt 131.2 lb

## 2022-06-19 DIAGNOSIS — M2141 Flat foot [pes planus] (acquired), right foot: Secondary | ICD-10-CM

## 2022-06-19 DIAGNOSIS — M2142 Flat foot [pes planus] (acquired), left foot: Secondary | ICD-10-CM | POA: Diagnosis not present

## 2022-06-19 DIAGNOSIS — S90122A Contusion of left lesser toe(s) without damage to nail, initial encounter: Secondary | ICD-10-CM

## 2022-06-19 NOTE — Patient Instructions (Addendum)
Warm soaks, gentle massage, monitor the area. Call if any pain, redness, or other changes.   Referral to podiatry for your flat feet concerns.

## 2022-06-19 NOTE — Progress Notes (Signed)
Subjective:    Patient ID: Lauren Cain, female    DOB: [birth year], 22 y.o.   MRN: [id number]  Chief Complaint  Patient presents with   Toe Injury    Pt c/o left pinky toe being black; tip of toe is black; pt podiatrist retired and may need new referral; pt states hit it on chair the other day;     HPI Patient is in today for "tip of left pinky toe is black." Here with mother today. States she hit it on a wooden chair yesterday. Denies any pain or numbness. Able to ambulate like normal. No open lesions or sores. Just has discoloration. 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     Past Medical History:  Diagnosis Date   Abnormal weight loss    Asthma    Dysmenorrhea    Eczema    Mental impairment    Mitral valve prolapse    URI (upper respiratory infection)     Past Surgical History:  Procedure Laterality Date   MOUTH SURGERY     x 2   TEAR DUCT PROBING Bilateral    TOENAIL EXCISION     removal    Family History  Problem Relation Age of Onset   Asthma Mother    Allergic rhinitis Mother    Obesity Mother    Allergic rhinitis Father    Prostate cancer Maternal Grandfather    Kidney disease Maternal Grandfather    Atrial fibrillation Maternal Grandfather     Social History   Tobacco Use   Smoking status: Never   Smokeless tobacco: Never  Vaping Use   Vaping Use: Never used  Substance Use Topics   Alcohol use: Not Currently   Drug use: Never     Allergies  Allergen Reactions   Shellfish-Derived Products Swelling   Cats Claw [Uncaria Tomentosa (Cats Claw)]    Dust Mite Extract    Other     TREES   Shellfish Allergy    Tree Extract     Review of Systems NEGATIVE UNLESS OTHERWISE INDICATED IN HPI      Objective:     BP 110/74 (BP Location: Left Arm)   Pulse 90   Temp 98 F (36.7 C) (Temporal)   Ht 5\' 3"  (1.6 m)   Wt 131 lb 3.2 oz (59.5 kg)   SpO2 97%   BMI 23.24 kg/m   Wt Readings from Last 3 Encounters:  06/19/22 131  lb 3.2 oz (59.5 kg)  06/05/22 131 lb 3.2 oz (59.5 kg)  02/27/22 126 lb 3.2 oz (57.2 kg)    BP Readings from Last 3 Encounters:  06/19/22 110/74  06/05/22 112/72  05/01/22 123/84     Physical Exam Vitals and nursing note reviewed.  Constitutional:      Appearance: Normal appearance.  Skin:    Comments: Small subcutaneous firm hematoma distal left pinky toe. 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 No pain. No erythema. N/V intact of entire digit.  Neurological:     General: No focal deficit present.     Mental Status: She is alert and oriented to person, place, and time.  Psychiatric:        Mood and Affect: Mood normal.        Assessment & Plan:  Flat feet, bilateral -     Ambulatory referral to Podiatry  Hematoma of toe of left foot, initial encounter   Reassured, should resolve on its own.  Warm soaks, gentle massage, monitor the area. No  further work-up needed at this time. Call if any pain, redness, or other changes.   Referral to podiatry for your flat feet concerns.     Return if symptoms worsen or fail to improve.      Raphael Espe M Keaton Beichner, PA-C

## 2022-07-03 ENCOUNTER — Ambulatory Visit: Payer: Medicaid Other | Admitting: Podiatry

## 2022-07-05 ENCOUNTER — Ambulatory Visit: Payer: Medicaid Other | Admitting: Allergy

## 2022-07-19 ENCOUNTER — Ambulatory Visit: Payer: Medicaid Other

## 2022-07-19 ENCOUNTER — Ambulatory Visit: Payer: Medicaid Other | Admitting: Allergy

## 2022-07-19 ENCOUNTER — Encounter: Payer: Self-pay | Admitting: Podiatry

## 2022-07-19 ENCOUNTER — Ambulatory Visit (INDEPENDENT_AMBULATORY_CARE_PROVIDER_SITE_OTHER): Payer: Medicaid Other | Admitting: Podiatry

## 2022-07-19 DIAGNOSIS — M2141 Flat foot [pes planus] (acquired), right foot: Secondary | ICD-10-CM | POA: Diagnosis not present

## 2022-07-19 DIAGNOSIS — M2142 Flat foot [pes planus] (acquired), left foot: Secondary | ICD-10-CM

## 2022-07-19 NOTE — Progress Notes (Signed)
  Subjective:  Patient ID: Lauren Cain, female    DOB: 09-30-2000,   MRN: [id number]  Chief Complaint  Patient presents with   Foot Problem     Bilateral flat feet    22 y.o. female presents for concern of flat feet on both feet that have been present for years. Relates she has had inserts in the past that have been helpful. Relates some pain in her arch with long walking and stiffness. Denies any other pedal complaints. Denies n/v/f/c.   Past Medical History:  Diagnosis Date   Abnormal weight loss    Asthma    Dysmenorrhea    Eczema    Mental impairment    Mitral valve prolapse    URI (upper respiratory infection)     Objective:  Physical Exam: Vascular: DP/PT pulses 2/4 bilateral. CFT <3 seconds. Normal hair growth on digits. No edema.  Skin. No lacerations or abrasions bilateral feet.  Musculoskeletal: MMT 5/5 bilateral lower extremities in DF, PF, Inversion and Eversion. Deceased ROM in DF of ankle joint. Collapse of the medial arch with increased eversion of the STJ. No pain to palpation along arch.  Neurological: Sensation intact to light touch.   Assessment:   1. Bilateral pes planus      Plan:  Patient was evaluated and treated and all questions answered. -Xrays reviewed. Pes planus noted bilateral with collapse of medial arch.  -Discussed treatement options; discussed pes planus deformity;conservative and  surgical  -Rx Orthotics from [facility] -Recommend good supportive shoes -Recommend daily stretching and icing -Patient to return to office as needed or sooner if condition worsens.   Louann Sjogren, DPM

## 2022-07-21 ENCOUNTER — Ambulatory Visit (INDEPENDENT_AMBULATORY_CARE_PROVIDER_SITE_OTHER): Payer: Medicaid Other | Admitting: Allergy

## 2022-07-21 ENCOUNTER — Other Ambulatory Visit: Payer: Self-pay

## 2022-07-21 ENCOUNTER — Encounter: Payer: Self-pay | Admitting: Allergy

## 2022-07-21 VITALS — BP 116/80 | HR 111 | Temp 98.8°F | Resp 18 | Ht 63.0 in | Wt 126.4 lb

## 2022-07-21 DIAGNOSIS — T7800XD Anaphylactic reaction due to unspecified food, subsequent encounter: Secondary | ICD-10-CM

## 2022-07-21 DIAGNOSIS — J4520 Mild intermittent asthma, uncomplicated: Secondary | ICD-10-CM

## 2022-07-21 DIAGNOSIS — L219 Seborrheic dermatitis, unspecified: Secondary | ICD-10-CM

## 2022-07-21 DIAGNOSIS — J3089 Other allergic rhinitis: Secondary | ICD-10-CM

## 2022-07-21 DIAGNOSIS — H1013 Acute atopic conjunctivitis, bilateral: Secondary | ICD-10-CM | POA: Diagnosis not present

## 2022-07-21 MED ORDER — FORMOTEROL FUMARATE 20 MCG/2ML IN NEBU: 20.0000 ug | INHALATION_SOLUTION | Freq: Two times a day (BID) | RESPIRATORY_TRACT | 5 refills | Status: DC

## 2022-07-21 MED ORDER — ALBUTEROL SULFATE (2.5 MG/3ML) 0.083% IN NEBU: INHALATION_SOLUTION | RESPIRATORY_TRACT | 5 refills | Status: DC

## 2022-07-21 MED ORDER — LEVOCETIRIZINE DIHYDROCHLORIDE 5 MG PO TABS: 5.0000 mg | ORAL_TABLET | Freq: Every evening | ORAL | 5 refills | Status: DC

## 2022-07-21 MED ORDER — BUDESONIDE 0.5 MG/2ML IN SUSP: 0.5000 mg | Freq: Two times a day (BID) | RESPIRATORY_TRACT | 5 refills | Status: DC

## 2022-07-21 MED ORDER — ALBUTEROL SULFATE HFA 108 (90 BASE) MCG/ACT IN AERS: 2.0000 | INHALATION_SPRAY | Freq: Four times a day (QID) | RESPIRATORY_TRACT | 2 refills | Status: AC | PRN

## 2022-07-21 MED ORDER — IPRATROPIUM BROMIDE 0.06 % NA SOLN: NASAL | 5 refills | Status: DC

## 2022-07-21 MED ORDER — MONTELUKAST SODIUM 10 MG PO TABS: 10.0000 mg | ORAL_TABLET | Freq: Every day | ORAL | 5 refills | Status: DC

## 2022-07-21 MED ORDER — KETOCONAZOLE 2 % EX SHAM: MEDICATED_SHAMPOO | CUTANEOUS | 5 refills | Status: DC

## 2022-07-21 MED ORDER — CROMOLYN SODIUM 4 % OP SOLN: 2.0000 [drp] | Freq: Four times a day (QID) | OPHTHALMIC | 5 refills | Status: DC | PRN

## 2022-07-21 NOTE — Progress Notes (Signed)
Follow-up Note  RE: Jarah Ross MRN: [id number] DOB: [birth year] Date of Office Visit: 07/21/2022   History of present illness: Allisha Simonelli is a 22 y.o. female presenting today for follow-up of asthma, allergic conjunctivitis, food allergy and seb dermatitis.  She was last seen in the office on 12/30/2021 by myself.  She presents today with her mother and a sibling. She has been doing fine since her last visit without any major health changes, surgeries or hospitalizations.  Mother states she will going to [place] for a camp for disabled persons. She states her allergies have been doing okay other than sneezing she has noted more this season.  She did run out of her montelukast about a week ago.  She does take Xyzal daily.  She has not really needed to use her nasal Atrovent spray and states maybe used it once or so a couple months ago.  She has not needed to use the cromolyn eyedrop at all. Her asthma she states has also been doing well at this time.  She has not needed to use her albuterol since last visit.  At that visit she was wearing treated with prednisone.  She continues on budesonide minutes of formoterol twice a day.  She has not required any further steroid needs since her last visit. He states the ketoconazole shampoo works well for her scalp symptoms.  She states it does not itch with the use of the shampoo.  She does wash her hair 1-2 times a week. She continues to avoid shellfish and she does have access to an epinephrine device.  Review of systems: Review of Systems  Constitutional: Negative.   HENT:  Positive for sneezing.   Eyes: Negative.   Respiratory: Negative.    Cardiovascular: Negative.   Gastrointestinal: Negative.   Musculoskeletal: Negative.   Skin: Negative.   Allergic/Immunologic: Negative.   Neurological: Negative.      All other systems negative unless noted above in HPI  Past medical/social/surgical/family history have been reviewed and are  unchanged unless specifically indicated below.  No changes  Medication List: Current Outpatient Medications  Medication Sig Dispense Refill   Ascorbic Acid (VITAMIN C PO) Take 500 mg by mouth daily.     calcium carbonate (OS-CAL) 600 MG tablet Take 1 tablet (600 mg total) by mouth daily. 30 tablet 2   Cholecalciferol (VITAMIN D) 50 MCG (2000 UT) tablet Take 1 tablet (2,000 Units total) by mouth daily. 30 tablet 2   diphenhydrAMINE (BENADRYL) 12.5 MG/5ML elixir TAKE ONE TIME AS NEEDED FOR UP TO 1 DOSE FOR ALLERGIES CAN GIVE 2ND DOSE AFTER 15 MINUTES)     EPINEPHrine 0.3 mg/0.3 mL IJ SOAJ injection Inject 0.3 mg into the muscle as needed for anaphylaxis. 2 each 1   Etonogestrel (NEXPLANON [place]) Inject into the skin.     mometasone (NASONEX) 50 MCG/ACT nasal spray Place 2 sprays into the nose daily. 17 g 5   Multiple Vitamin (MULTIVITAMIN ADULT) TABS Take 1 tablet by mouth daily. 90 tablet 1   Multiple Vitamins-Minerals (MULTIVITAMIN WITH MINERALS) tablet Take 1 tablet by mouth daily.     polyethylene glycol (MIRALAX / GLYCOLAX) 17 g packet Take 17 g by mouth daily. (Patient taking differently: Take 17 g by mouth as needed for mild constipation or moderate constipation.) 14 each 3   Respiratory Therapy Supplies (NEBULIZER MASK ADULT) MISC Take 1 each by nebulization as directed. 1 each 1   risperiDONE (RISPERDAL) 1 MG tablet TAKE 1 TABLET EVERY  DAY (Patient taking differently: Take 0.5 mg by mouth at bedtime. TAKE 1 TABLET EVERY DAY) 31 tablet 2   sertraline (ZOLOFT) 50 MG tablet Take 50 mg by mouth at bedtime.     Spacer/Aero Chamber [employer] Use as directed. 1 each 0   albuterol (PROVENTIL) (2.5 MG/3ML) 0.083% nebulizer solution USE 1 VIAL IN NEBULIZER EVERY 4 HOURS AS NEEDED FOR WHEEZING/SHORTNESS OF BREATH 75 mL 5   albuterol (VENTOLIN HFA) 108 (90 Base) MCG/ACT inhaler Inhale 2 puffs into the lungs every 6 (six) hours as needed for wheezing or shortness of breath. 2 each 2    budesonide (PULMICORT) 0.5 MG/2ML nebulizer solution Take 2 mLs (0.5 mg total) by nebulization 2 (two) times daily. Mix with Formoterol in nebulizer 120 mL 5   cromolyn (OPTICROM) 4 % ophthalmic solution Place 2 drops into both eyes 4 (four) times daily as needed (Itchy, watery eyes). 10 mL 5   formoterol (PERFOROMIST) 20 MCG/2ML nebulizer solution Take 2 mLs (20 mcg total) by nebulization 2 (two) times daily. Mix Budesonide vial with Formoterol vial in nebulizer twice a day. Do not use Formoterol alone. 120 mL 5   ipratropium (ATROVENT) 0.06 % nasal spray 1-2 sprays each nostril 1-2 times a day as needed for nasal congestion or drainage 15 mL 5   ketoconazole (NIZORAL) 2 % shampoo 1 Application 1-2 times a week 120 mL 5   levocetirizine (XYZAL) 5 MG tablet Take 1 tablet (5 mg total) by mouth every evening. 30 tablet 5   montelukast (SINGULAIR) 10 MG tablet Take 1 tablet (10 mg total) by mouth at bedtime. 30 tablet 5   norethindrone (AYGESTIN) 5 MG tablet Take 1 tablet (5 mg total) by mouth daily. 30 tablet 2   No current facility-administered medications for this visit.     Known medication allergies: Allergies  Allergen Reactions   Shellfish-Derived Products Swelling   Cats Claw [Uncaria Tomentosa (Cats Claw)]    Dust Mite Extract    Other     TREES   Shellfish Allergy    Tree Extract      Physical examination: Blood pressure 116/80, pulse (!) 111, temperature 98.8 F (37.1 C), resp. rate 18, height 5\' 3"  (1.6 m), weight 126 lb 6.4 oz (57.3 kg), SpO2 98 %.  General: Alert, interactive, in no acute distress. HEENT: PERRLA, TMs pearly gray, turbinates non-edematous without discharge, post-pharynx non erythematous. Neck: Supple without lymphadenopathy. Lungs: Clear to auscultation without wheezing, rhonchi or rales. {no increased work of breathing. CV: Normal S1, S2 without murmurs. Abdomen: Nondistended, nontender. Skin: Warm and dry, without lesions or rashes. Extremities:  No  clubbing, cyanosis or edema. Neuro:   Grossly intact.  Diagnositics/Labs:  Spirometry: FEV1: 1.96L 71%, FVC: 2.17L 69% predicted.  This is a improved study from a previous study.  Does remain slightly low for her age and demographic.   Assessment and plan:   Asthma  - under good control at this time - continue Budesonide 0.5mg  mixed with Formoterol nebulized twice daily (in morning and at night).  - Use albuterol rescue inhaler or nebulizer as needed, up to 2 puffs every 4-6 hours during an asthma exacerbation.  - Recommend using albuterol prior to activity/exercise.  Use the albuterol pump inhaler with spacer.  Use 2 puffs 15-20 minutes prior to exercise/activity  - Let us know if you are not meeting the following goals.   Asthma control goals:  Full participation in all desired activities (may need albuterol before activity) Albuterol use  two time or less a week on average (not counting use with activity) Cough interfering with sleep two time or less a month Oral steroids no more than once a year No hospitalizations  Allergic rhinitis - Continue Xyzal 5mg  daily during the week.  If needed can take extra dose in the AM.   - Use nasal Atrovent 1-2 sprays each nostril 1-2 times a day for nasal congestion or drainage - Use nasal saline spray daily to help decrease risk of nosebleeds - Continue montelukast 10 mg daily - Use Cromolyn 1-2 drop each eye daily up to 3-4 times a day as needed for itchy/watery eyes. - Continue to use dust covers for dust mite control  Food allergies  - Continue to avoid all shellfish - Have access to your EpiPen at all times, and follow your emergency action plan.  Seborrheic dermatitis - recommend use ketoconzole shampoo 1-2 times a week - try to avoid heavy oiling of the scalp   Follow up in 6 months or sooner if needed.    I appreciate the opportunity to take part in Larcenia's care. Please do not hesitate to contact me with  questions.  Sincerely,   Margo Aye, MD Allergy/Immunology [facility]

## 2022-07-21 NOTE — Patient Instructions (Addendum)
Asthma  - under good control at this time - continue Budesonide 0.5mg  mixed with Formoterol nebulized twice daily (in morning and at night).  - Use albuterol rescue inhaler or nebulizer as needed, up to 2 puffs every 4-6 hours during an asthma exacerbation.  - Recommend using albuterol prior to activity/exercise.  Use the albuterol pump inhaler with spacer.  Use 2 puffs 15-20 minutes prior to exercise/activity  - Let us know if you are not meeting the following goals.   Asthma control goals:  Full participation in all desired activities (may need albuterol before activity) Albuterol use two time or less a week on average (not counting use with activity) Cough interfering with sleep two time or less a month Oral steroids no more than once a year No hospitalizations  Allergic rhinitis - Continue Xyzal 5mg  daily during the week.  If needed can take extra dose in the AM.   - Use nasal Atrovent 1-2 sprays each nostril 1-2 times a day for nasal congestion or drainage - Use nasal saline spray daily to help decrease risk of nosebleeds - Continue montelukast 10 mg daily - Use Cromolyn 1-2 drop each eye daily up to 3-4 times a day as needed for itchy/watery eyes. - Continue to use dust covers for dust mite control  Food allergies  - Continue to avoid all shellfish - Have access to your EpiPen at all times, and follow your emergency action plan.  Seborrheic dermatitis - recommend use ketoconzole shampoo 1-2 times a week - try to avoid heavy oiling of the scalp   Follow up in 6 months or sooner if needed.

## 2022-08-28 ENCOUNTER — Ambulatory Visit: Payer: Medicaid Other | Admitting: Physician Assistant

## 2022-09-13 ENCOUNTER — Encounter (INDEPENDENT_AMBULATORY_CARE_PROVIDER_SITE_OTHER): Payer: Self-pay

## 2022-09-19 ENCOUNTER — Other Ambulatory Visit: Payer: Self-pay | Admitting: Physician Assistant

## 2022-09-26 ENCOUNTER — Ambulatory Visit (INDEPENDENT_AMBULATORY_CARE_PROVIDER_SITE_OTHER): Payer: MEDICAID | Admitting: Physician Assistant

## 2022-09-26 VITALS — BP 120/70 | HR 90 | Temp 97.7°F | Ht 63.0 in | Wt 133.4 lb

## 2022-09-26 DIAGNOSIS — I340 Nonrheumatic mitral (valve) insufficiency: Secondary | ICD-10-CM

## 2022-09-26 DIAGNOSIS — F39 Unspecified mood [affective] disorder: Secondary | ICD-10-CM

## 2022-09-26 DIAGNOSIS — F79 Unspecified intellectual disabilities: Secondary | ICD-10-CM | POA: Diagnosis not present

## 2022-09-26 DIAGNOSIS — Z3009 Encounter for other general counseling and advice on contraception: Secondary | ICD-10-CM

## 2022-09-26 NOTE — Patient Instructions (Addendum)
Referral to [facility]: Address: [address], [place], [place] [address] Phone: [phone]  Call for next cardiology appointment:  Oran Rein, MD Internal Medicine, Cardiovascular Disease [phone]  Have a great year! Call if any concerns.

## 2022-09-26 NOTE — Assessment & Plan Note (Signed)
Follows with psych Chronic, stable Continues Risperdal 0.5 mg at bedtime & Zoloft 50 mg daily

## 2022-09-26 NOTE — Progress Notes (Signed)
Subjective:    Patient ID: Lauren Cain, female    DOB: 12-21-2000, 22 y.o.   MRN: [id number]  Chief Complaint  Patient presents with   Follow-up    6 month f/u for mood. No other concerns.    HPI Patient is in today for regular f/up. Here with mom. Doing well overall.  Follows with [facility]. [facility] psychological-counseling and assessments.Testing will be completed Aug 26th.    She is taking Zoloft and Risperdal daily.    Past Medical History:  Diagnosis Date   Abnormal weight loss    Asthma    Dysmenorrhea    Eczema    Mental impairment    Mitral valve prolapse    URI (upper respiratory infection)     Past Surgical History:  Procedure Laterality Date   MOUTH SURGERY     x 2   TEAR DUCT PROBING Bilateral    TOENAIL EXCISION     removal    Family History  Problem Relation Age of Onset   Asthma Mother    Allergic rhinitis Mother    Obesity Mother    Allergic rhinitis Father    Prostate cancer Maternal Grandfather    Kidney disease Maternal Grandfather    Atrial fibrillation Maternal Grandfather     Social History   Tobacco Use   Smoking status: Never   Smokeless tobacco: Never  Vaping Use   Vaping status: Never Used  Substance Use Topics   Alcohol use: Not Currently   Drug use: Never     Allergies  Allergen Reactions   Shellfish-Derived Products Swelling   Cats Claw [Uncaria Tomentosa (Cats Claw)]    Dust Mite Extract    Other     TREES   Shellfish Allergy    Tree Extract     Review of Systems NEGATIVE UNLESS OTHERWISE INDICATED IN HPI      Objective:     BP 120/70 (BP Location: Right Arm, Patient Position: Sitting, Cuff Size: Normal)   Pulse 90   Temp 97.7 F (36.5 C) (Temporal)   Ht 5\' 3"  (1.6 m)   Wt 133 lb 6.4 oz (60.5 kg)   SpO2 97%   BMI 23.63 kg/m   Wt Readings from Last 3 Encounters:  09/26/22 133 lb 6.4 oz (60.5 kg)  07/21/22 126 lb 6.4 oz (57.3 kg)  06/19/22 131 lb 3.2 oz (59.5 kg)    BP Readings  from Last 3 Encounters:  09/26/22 120/70  07/21/22 116/80  06/19/22 110/74     Physical Exam Vitals and nursing note reviewed.  Constitutional:      Appearance: Normal appearance. She is normal weight. She is not toxic-appearing.  HENT:     Head: Normocephalic and atraumatic.  Eyes:     Extraocular Movements: Extraocular movements intact.     Conjunctiva/sclera: Conjunctivae normal.     Pupils: Pupils are equal, round, and reactive to light.  Cardiovascular:     Rate and Rhythm: Normal rate and regular rhythm.     Pulses: Normal pulses.     Heart sounds: Murmur heard.  Pulmonary:     Effort: Pulmonary effort is normal.     Breath sounds: Normal breath sounds.  Musculoskeletal:        General: Normal range of motion.     Cervical back: Normal range of motion and neck supple.  Skin:    General: Skin is warm and dry.  Neurological:     General: No focal deficit present.  Mental Status: She is alert and oriented to person, place, and time.  Psychiatric:        Mood and Affect: Mood normal.        Assessment & Plan:  Mood disorder [facility]) Assessment & Plan: Follows with psych Chronic, stable Continues Risperdal 0.5 mg at bedtime & Zoloft 50 mg daily    Intellectual disability -     Ambulatory referral to Gynecology  Birth control counseling -     Ambulatory referral to Gynecology  Mitral valve insufficiency, unspecified etiology Assessment & Plan: Follows with cardiology - needs to be seen in Oct / Nov this year for repeat ECHO        Return in about 1 year (around 09/26/2023) for physical.    Kymiah Araiza M Grisela Mesch, PA-C

## 2022-09-26 NOTE — Assessment & Plan Note (Signed)
Follows with cardiology - needs to be seen in Oct / Nov this year for repeat ECHO

## 2022-11-20 ENCOUNTER — Other Ambulatory Visit: Payer: Self-pay | Admitting: Allergy

## 2022-11-21 ENCOUNTER — Ambulatory Visit (INDEPENDENT_AMBULATORY_CARE_PROVIDER_SITE_OTHER): Payer: MEDICAID

## 2022-11-21 DIAGNOSIS — Z23 Encounter for immunization: Secondary | ICD-10-CM | POA: Diagnosis not present

## 2022-12-01 ENCOUNTER — Other Ambulatory Visit: Payer: Self-pay

## 2022-12-01 DIAGNOSIS — I340 Nonrheumatic mitral (valve) insufficiency: Secondary | ICD-10-CM

## 2022-12-05 ENCOUNTER — Ambulatory Visit: Payer: Medicaid Other | Admitting: Physician Assistant

## 2022-12-13 ENCOUNTER — Other Ambulatory Visit: Payer: Self-pay | Admitting: Allergy

## 2023-01-01 ENCOUNTER — Other Ambulatory Visit (HOSPITAL_COMMUNITY): Payer: MEDICAID

## 2023-02-14 ENCOUNTER — Other Ambulatory Visit: Payer: Self-pay | Admitting: Allergy

## 2023-02-20 ENCOUNTER — Ambulatory Visit (HOSPITAL_COMMUNITY): Payer: MEDICAID

## 2023-03-01 ENCOUNTER — Telehealth: Payer: Self-pay

## 2023-03-01 NOTE — Telephone Encounter (Signed)
Copied from CRM [id number]. Topic: Referral - Status >> Mar 01, 2023 11:42 AM Hector Shade B wrote: Reason for CRM: Patient's family member (mother) called to check the status of a referral request for ob/gyn contact [phone]  Returned pt Mom call and advised phone number to try and schedule GYN appt but a new referral may be required. Advised to call office and see if she can schedule. Burna Mortimer also asked regarding ECHO and advised she will need to call office to reschedule appt. Verbalized understanding

## 2023-03-03 ENCOUNTER — Other Ambulatory Visit: Payer: Self-pay | Admitting: Allergy

## 2023-03-03 ENCOUNTER — Other Ambulatory Visit: Payer: Self-pay | Admitting: Physician Assistant

## 2023-03-03 DIAGNOSIS — N921 Excessive and frequent menstruation with irregular cycle: Secondary | ICD-10-CM

## 2023-03-05 ENCOUNTER — Ambulatory Visit: Payer: MEDICAID | Admitting: Internal Medicine

## 2023-03-13 ENCOUNTER — Ambulatory Visit: Payer: MEDICAID | Admitting: Physician Assistant

## 2023-03-20 ENCOUNTER — Other Ambulatory Visit (HOSPITAL_COMMUNITY): Payer: MEDICAID

## 2023-03-23 ENCOUNTER — Other Ambulatory Visit: Payer: Self-pay

## 2023-03-23 ENCOUNTER — Emergency Department (HOSPITAL_BASED_OUTPATIENT_CLINIC_OR_DEPARTMENT_OTHER): Payer: MEDICAID

## 2023-03-23 ENCOUNTER — Telehealth: Payer: Self-pay | Admitting: Physician Assistant

## 2023-03-23 ENCOUNTER — Emergency Department (HOSPITAL_BASED_OUTPATIENT_CLINIC_OR_DEPARTMENT_OTHER)
Admission: EM | Admit: 2023-03-23 | Discharge: 2023-03-23 | Disposition: A | Discharge status: Home / Self Care | Payer: MEDICAID | Attending: Emergency Medicine | Admitting: Emergency Medicine

## 2023-03-23 ENCOUNTER — Encounter (HOSPITAL_BASED_OUTPATIENT_CLINIC_OR_DEPARTMENT_OTHER): Payer: Self-pay | Admitting: Emergency Medicine

## 2023-03-23 DIAGNOSIS — Z79899 Other long term (current) drug therapy: Secondary | ICD-10-CM | POA: Insufficient documentation

## 2023-03-23 DIAGNOSIS — J101 Influenza due to other identified influenza virus with other respiratory manifestations: Secondary | ICD-10-CM | POA: Diagnosis not present

## 2023-03-23 DIAGNOSIS — Z20822 Contact with and (suspected) exposure to covid-19: Secondary | ICD-10-CM | POA: Insufficient documentation

## 2023-03-23 DIAGNOSIS — R059 Cough, unspecified: Secondary | ICD-10-CM | POA: Diagnosis present

## 2023-03-23 LAB — RESP PANEL BY RT-PCR (RSV, FLU A&B, COVID)  RVPGX2
Influenza A by PCR: POSITIVE — AB
Influenza B by PCR: NEGATIVE
Resp Syncytial Virus by PCR: NEGATIVE
SARS Coronavirus 2 by RT PCR: NEGATIVE

## 2023-03-23 NOTE — ED Provider Notes (Signed)
 [facility] AT [facility] Provider Note   CSN: [id number] Arrival date & time: 03/23/23  9061     History  Chief Complaint  Patient presents with  . Cough    Lauren Cain is a 23 y.o. female.  Pt with non prod cough, rhinorrhea, body aches, in past few das. Sibling with recent dx flu.  No known fevers. No trouble breathing, is eating/drinking. No abd pain or nvd. No gu c/o.   The history is provided by the patient and a parent.  Cough Associated symptoms: myalgias   Associated symptoms: no chest pain, no eye discharge, no fever, no headaches, no rash, no shortness of breath and no sore throat        Home Medications Prior to Admission medications   Medication Sig Start Date End Date Taking? Authorizing Provider  albuterol  (PROVENTIL ) (2.5 MG/3ML) 0.083% nebulizer solution USE 1 VIAL IN NEBULIZER EVERY 4 HOURS AS NEEDED FOR WHEEZING/SHORTNESS OF BREATH 03/05/23   Jeneal Danita Macintosh, MD  albuterol  (VENTOLIN  HFA) 108 (90 Base) MCG/ACT inhaler Inhale 2 puffs into the lungs every 6 (six) hours as needed for wheezing or shortness of breath. 07/21/22   Jeneal Danita Macintosh, MD  Ascorbic Acid (VITAMIN C PO) Take 500 mg by mouth daily.    [provider]  budesonide  (PULMICORT ) 0.5 MG/2ML nebulizer solution Take 2 mLs (0.5 mg total) by nebulization 2 (two) times daily. Mix with Formoterol  in nebulizer 07/21/22   Jeneal Danita Macintosh, MD  Calcium  Carbonate Antacid 648 MG TABS TAKE 1 TABLET BY MOUTH EVERY DAY 09/19/22   Allwardt, Alyssa M, PA-C  Cholecalciferol (VITAMIN D ) 50 MCG (2000 UT) tablet Take 1 tablet (2,000 Units total) by mouth daily. 03/28/21   Joshua Bari HERO, NP  cromolyn  (OPTICROM ) 4 % ophthalmic solution Place 2 drops into both eyes 4 (four) times daily as needed (Itchy, watery eyes). 07/21/22   Jeneal Danita Macintosh, MD  diphenhydrAMINE (BENADRYL) 12.5 MG/5ML elixir TAKE ONE TIME AS NEEDED FOR UP TO 1 DOSE FOR  ALLERGIES CAN GIVE 2ND DOSE AFTER 15 MINUTES) 08/09/15   [provider]  EPINEPHrine  0.3 mg/0.3 mL IJ SOAJ injection Inject 0.3 mg into the muscle as needed for anaphylaxis. 12/30/21   Jeneal Danita Macintosh, MD  Etonogestrel  (NEXPLANON  [place]) Inject into the skin.    [provider]  formoterol  (PERFOROMIST ) 20 MCG/2ML nebulizer solution Take 2 mLs (20 mcg total) by nebulization 2 (two) times daily. Mix Budesonide  vial with Formoterol  vial in nebulizer twice a day. Do not use Formoterol  alone. 07/21/22   Jeneal Danita Macintosh, MD  ipratropium (ATROVENT ) 0.06 % nasal spray 1-2 SPRAYS EACH NOSTRIL 1-2 TIMES A DAY AS NEEDED FOR NASAL CONGESTION OR DRAINAGE 12/13/22   Jeneal Danita Macintosh, MD  ketoconazole  (NIZORAL ) 2 % shampoo 1 Application 1-2 times a week 07/21/22   Jeneal Danita Macintosh, MD  levocetirizine (XYZAL ) 5 MG tablet Take 1 tablet (5 mg total) by mouth every evening. 07/21/22   Jeneal Danita Macintosh, MD  mometasone  (NASONEX ) 50 MCG/ACT nasal spray Place 2 sprays into the nose daily. 09/10/19   Jeneal Danita Macintosh, MD  montelukast  (SINGULAIR ) 10 MG tablet TAKE 1 TABLET BY MOUTH EVERYDAY AT BEDTIME 02/15/23   Jeneal Danita Macintosh, MD  Multiple Vitamin (MULTIVITAMIN ADULT) TABS Take 1 tablet by mouth daily. 10/18/21   Allwardt, Mardy HERO, PA-C  Multiple Vitamins-Minerals (MULTIVITAMIN WITH MINERALS) tablet Take 1 tablet by mouth daily.    [provider]  norethindrone  (AYGESTIN )  5 MG tablet Take 1 tablet (5 mg total) by mouth daily. 02/27/22 05/28/22  Allwardt, Mardy HERO, PA-C  Respiratory Therapy Supplies (NEBULIZER MASK ADULT) MISC Take 1 each by nebulization as directed. 01/28/21   Jeneal Danita Macintosh, MD  risperiDONE  (RISPERDAL ) 1 MG tablet TAKE 1 TABLET EVERY DAY Patient taking differently: Take 0.5 mg by mouth at bedtime. TAKE 1 TABLET EVERY DAY 12/04/18   Butch Cheryl RAMAN, MD  sertraline (ZOLOFT) 50 MG tablet Take 50 mg by mouth at bedtime.  05/09/20   [provider]  Spacer/Aero Chamber Mouthpiece MISC Use as directed. 12/22/16   Jeneal Danita Macintosh, MD      Allergies    Shellfish-derived products, Cats claw artelia tomentosa (cats claw)], Dust mite extract, Other, Shellfish allergy, and Tree extract    Review of Systems   Review of Systems  Constitutional:  Negative for fever.  HENT:  Negative for sore throat and trouble swallowing.   Eyes:  Negative for discharge and redness.  Respiratory:  Positive for cough. Negative for shortness of breath.   Cardiovascular:  Negative for chest pain.  Gastrointestinal:  Negative for abdominal pain, diarrhea and vomiting.  Genitourinary:  Negative for dysuria.  Musculoskeletal:  Positive for myalgias. Negative for neck pain and neck stiffness.  Skin:  Negative for rash.  Neurological:  Negative for headaches.    Physical Exam Updated Vital Signs BP (!) 141/70 (BP Location: Left Arm)   Pulse 72   Temp 98 F (36.7 C) (Oral)   Resp 18   Ht 1.6 m (5' 3)   Wt 60.5 kg   BMI 23.63 kg/m  Physical Exam Vitals and nursing note reviewed.  Constitutional:      Appearance: Normal appearance. She is well-developed.  HENT:     Head: Atraumatic.     Comments: No sinus or temporal tenderness.     Right Ear: Tympanic membrane normal.     Left Ear: Tympanic membrane normal.     Nose: Nose normal.     Mouth/Throat:     Mouth: Mucous membranes are moist.     Pharynx: Oropharynx is clear. No oropharyngeal exudate or posterior oropharyngeal erythema.  Eyes:     General: No scleral icterus.    Conjunctiva/sclera: Conjunctivae normal.     Pupils: Pupils are equal, round, and reactive to light.  Neck:     Trachea: No tracheal deviation.     Comments: No stiffness or rigidity.  Cardiovascular:     Rate and Rhythm: Normal rate and regular rhythm.     Pulses: Normal pulses.     Heart sounds: Normal heart sounds. No murmur heard.    No friction rub. No gallop.  Pulmonary:      Effort: Pulmonary effort is normal. No respiratory distress.     Breath sounds: Normal breath sounds.  Abdominal:     General: There is no distension.     Palpations: Abdomen is soft.     Tenderness: There is no abdominal tenderness.  Genitourinary:    Comments: No cva tenderness.  Musculoskeletal:        General: No swelling.     Cervical back: Normal range of motion and neck supple. No rigidity. No muscular tenderness.     Right lower leg: No edema.     Left lower leg: No edema.  Lymphadenopathy:     Cervical: No cervical adenopathy.  Skin:    General: Skin is warm and dry.     Findings: No rash.  Neurological:     Mental Status: She is alert.     Comments: Alert, content. Steady gait.   Psychiatric:        Mood and Affect: Mood normal.    ED Results / Procedures / Treatments   Labs (all labs ordered are listed, but only abnormal results are displayed) Results for orders placed or performed during the hospital encounter of 03/23/23  Resp panel by RT-PCR (RSV, Flu A&B, Covid) Anterior Nasal Swab   Collection Time: 03/23/23  9:49 AM   Specimen: Anterior Nasal Swab  Result Value Ref Range   SARS Coronavirus 2 by RT PCR NEGATIVE NEGATIVE   Influenza A by PCR POSITIVE (A) NEGATIVE   Influenza B by PCR NEGATIVE NEGATIVE   Resp Syncytial Virus by PCR NEGATIVE NEGATIVE    EKG None  Radiology No results found.  Procedures Procedures    Medications Ordered in ED Medications - No data to display  ED Course/ Medical Decision Making/ A&P                                 Medical Decision Making Problems Addressed: Influenza A: acute illness or injury with systemic symptoms that poses a threat to life or bodily functions  Amount and/or Complexity of Data Reviewed Independent Historian: parent    Details: hx External Data Reviewed: notes. Labs: ordered. Decision-making details documented in ED Course. Radiology: ordered.   Labs sent.   Reviewed nursing  notes and prior charts for additional history.   Labs reviewed/interpreted by me - flu is positive.   Xrays reviewed/interpreted by me - no pna.   Pt breathing comfortably. No distress.   Pt currently appears stable for d/c.   Return precautions provided.           Final Clinical Impression(s) / ED Diagnoses Final diagnoses:  None    Rx / DC Orders ED Discharge Orders     None         Bernard Drivers, MD 03/24/23 1247

## 2023-03-23 NOTE — ED Triage Notes (Signed)
 C/o cough, chest congestion, and headache x 4 days. Knonw exposure to FluA. Denies fevers. Patient has intellectual disability. Mother is present during triage.

## 2023-03-23 NOTE — Discharge Instructions (Addendum)
 It was our pleasure to provide your ER care today - we hope that you feel better. Your flu test is positive - see attached information.   Drink plenty of fluids/stay well hydrated.  You may try nyquil, mucinex, robitussin or similar over the counter cold and flu medication for symptom relief.   Follow up with primary care doctor in two weeks if symptoms fail to improve/resolve.  Return to ER if worse, new symptoms, increased trouble breathing, or other emergency concern.

## 2023-03-23 NOTE — Telephone Encounter (Signed)
 Please see note and advise

## 2023-03-23 NOTE — Telephone Encounter (Unsigned)
 Copied from CRM [id number]. Topic: Appointments - Scheduling Inquiry for Clinic >> Mar 23, 2023 11:53 AM Heather  B wrote: Reason for CRM: Patient has Flu-A per Urgent Care, not feeling better, would like sooner appt or advice from PCP care team. [phone]

## 2023-03-26 NOTE — Telephone Encounter (Signed)
 Pt scheduled to see PCP this week

## 2023-03-29 ENCOUNTER — Other Ambulatory Visit: Payer: Self-pay

## 2023-03-29 ENCOUNTER — Ambulatory Visit (INDEPENDENT_AMBULATORY_CARE_PROVIDER_SITE_OTHER): Payer: MEDICAID | Admitting: Allergy

## 2023-03-29 ENCOUNTER — Encounter: Payer: Self-pay | Admitting: Allergy

## 2023-03-29 VITALS — BP 102/72 | HR 88 | Temp 98.4°F | Resp 16 | Ht 63.0 in | Wt 138.2 lb

## 2023-03-29 DIAGNOSIS — J101 Influenza due to other identified influenza virus with other respiratory manifestations: Secondary | ICD-10-CM | POA: Diagnosis not present

## 2023-03-29 DIAGNOSIS — T7800XD Anaphylactic reaction due to unspecified food, subsequent encounter: Secondary | ICD-10-CM

## 2023-03-29 DIAGNOSIS — J4521 Mild intermittent asthma with (acute) exacerbation: Secondary | ICD-10-CM | POA: Diagnosis not present

## 2023-03-29 DIAGNOSIS — L219 Seborrheic dermatitis, unspecified: Secondary | ICD-10-CM

## 2023-03-29 DIAGNOSIS — H1013 Acute atopic conjunctivitis, bilateral: Secondary | ICD-10-CM

## 2023-03-29 DIAGNOSIS — J3089 Other allergic rhinitis: Secondary | ICD-10-CM

## 2023-03-29 MED ORDER — MONTELUKAST SODIUM 10 MG PO TABS: 10.0000 mg | ORAL_TABLET | Freq: Every day | ORAL | 5 refills | Status: AC

## 2023-03-29 MED ORDER — CROMOLYN SODIUM 4 % OP SOLN: 2.0000 [drp] | Freq: Four times a day (QID) | OPHTHALMIC | 5 refills | Status: AC | PRN

## 2023-03-29 MED ORDER — BUDESONIDE 0.5 MG/2ML IN SUSP: 0.5000 mg | Freq: Two times a day (BID) | RESPIRATORY_TRACT | 5 refills | Status: AC

## 2023-03-29 MED ORDER — AMOXICILLIN-POT CLAVULANATE 875-125 MG PO TABS: 1.0000 | ORAL_TABLET | Freq: Two times a day (BID) | ORAL | 0 refills | Status: AC

## 2023-03-29 MED ORDER — IPRATROPIUM BROMIDE 0.06 % NA SOLN: NASAL | 0 refills | Status: DC

## 2023-03-29 MED ORDER — LEVOCETIRIZINE DIHYDROCHLORIDE 5 MG PO TABS: 5.0000 mg | ORAL_TABLET | Freq: Every evening | ORAL | 5 refills | Status: AC

## 2023-03-29 MED ORDER — ALBUTEROL SULFATE (2.5 MG/3ML) 0.083% IN NEBU: INHALATION_SOLUTION | RESPIRATORY_TRACT | 5 refills | Status: AC

## 2023-03-29 MED ORDER — PREDNISONE 1 MG PO TABS
30.0000 mg | ORAL_TABLET | Freq: Once | ORAL | Status: AC
  Administered 2023-03-29: 30 mg via ORAL

## 2023-03-29 MED ORDER — PREDNISONE 1 MG PO TABS: 30.0000 mg | ORAL_TABLET | Freq: Every day | ORAL | Status: DC

## 2023-03-29 MED ORDER — PREDNISONE 20 MG PO TABS: 20.0000 mg | ORAL_TABLET | Freq: Two times a day (BID) | ORAL | 0 refills | Status: AC

## 2023-03-29 MED ORDER — EPINEPHRINE 0.3 MG/0.3ML IJ SOAJ: 0.3000 mg | INTRAMUSCULAR | 1 refills | Status: AC | PRN

## 2023-03-29 MED ORDER — FORMOTEROL FUMARATE 20 MCG/2ML IN NEBU: 20.0000 ug | INHALATION_SOLUTION | Freq: Two times a day (BID) | RESPIRATORY_TRACT | 5 refills | Status: DC

## 2023-03-29 MED ORDER — KETOCONAZOLE 2 % EX SHAM: MEDICATED_SHAMPOO | CUTANEOUS | 5 refills | Status: AC

## 2023-03-29 MED ORDER — ALBUTEROL SULFATE (2.5 MG/3ML) 0.083% IN NEBU
2.5000 mg | INHALATION_SOLUTION | Freq: Once | RESPIRATORY_TRACT | Status: AC
  Administered 2023-03-29: 2.5 mg via RESPIRATORY_TRACT

## 2023-03-29 NOTE — Addendum Note (Signed)
Addended by: Dub Mikes on: 03/29/2023 12:08 PM   Modules accepted: Orders

## 2023-03-29 NOTE — Progress Notes (Signed)
Follow-up Note  RE: Marletta Bousquet MRN: [id number] DOB: Apr 20, 2000 Date of Office Visit: 03/29/2023   History of present illness: Ebonee Stober is a 23 y.o. female presenting today for sick visit and follow-up of asthma, allergic rhinitis with conjunctivitis, food allergy and dermatitis.  She was last seen in the office on 01/21/23 by myself.  She presents today with her mother. Discussed the use of AI scribe software for clinical note transcription with the patient, who gave verbal consent to proceed.  She was diagnosed with the fluA six days ago and has been experiencing symptoms such as ongoing difficulty breathing, coughing and wheezing. No fever, body aches, or chills noted. She notes a decrease in appetite and mother has been pushing her fluid intake to stay hydrated. Her sleep is disturbed by coughing. She has been using a nebulizer machine with albuterol but has not had access to her budesonide medication as did not receive from pharmacy stating some insurance issue, resulting in a lack of medication since her last visit. Additionally, her montelukast prescription was not filled either. The only medication she has been able to use is levocetirizine for allergies. She mentions that her sister had the flu first, which is likely how she contracted it. Prior to the flu, her asthma symptoms were controlled.  She went to the [facility] last Friday where she got diagnosed with Flu and states the person she saw mentioned her taking tamiflu and steroids and then left the room and never came back and they got discharged from the clinic without any prescriptions.  So she did not get the tamiflu or steroid medications. CXR at the time was negative for pneumonia.  She has not been using nasal sprays recently as she does not have any of these either. She continues to avoid shellfish and has access to epinephrine device. She has used ketoconozole with good success for her scalp dermatitis.      Review of  systems: 10pt ROS negative unless noted above in HPI   All other systems negative unless noted above in HPI  Past medical/social/surgical/family history have been reviewed and are unchanged unless specifically indicated below.  No changes  Medication List: Current Outpatient Medications  Medication Sig Dispense Refill   albuterol (PROVENTIL) (2.5 MG/3ML) 0.083% nebulizer solution USE 1 VIAL IN NEBULIZER EVERY 4 HOURS AS NEEDED FOR WHEEZING/SHORTNESS OF BREATH 75 mL 5   albuterol (VENTOLIN HFA) 108 (90 Base) MCG/ACT inhaler Inhale 2 puffs into the lungs every 6 (six) hours as needed for wheezing or shortness of breath. 2 each 2   Ascorbic Acid (VITAMIN C PO) Take 500 mg by mouth daily.     Calcium Carbonate Antacid 648 MG TABS TAKE 1 TABLET BY MOUTH EVERY DAY 90 tablet 1   Cholecalciferol (VITAMIN D) 50 MCG (2000 UT) tablet Take 1 tablet (2,000 Units total) by mouth daily. 30 tablet 2   cromolyn (OPTICROM) 4 % ophthalmic solution Place 2 drops into both eyes 4 (four) times daily as needed (Itchy, watery eyes). 10 mL 5   diphenhydrAMINE (BENADRYL) 12.5 MG/5ML elixir TAKE ONE TIME AS NEEDED FOR UP TO 1 DOSE FOR ALLERGIES CAN GIVE 2ND DOSE AFTER 15 MINUTES)     EPINEPHrine 0.3 mg/0.3 mL IJ SOAJ injection Inject 0.3 mg into the muscle as needed for anaphylaxis. 2 each 1   Etonogestrel (NEXPLANON [place]) Inject into the skin.     ketoconazole (NIZORAL) 2 % shampoo 1 Application 1-2 times a week 120 mL 5  levocetirizine (XYZAL) 5 MG tablet Take 1 tablet (5 mg total) by mouth every evening. 30 tablet 5   Multiple Vitamin (MULTIVITAMIN ADULT) TABS Take 1 tablet by mouth daily. 90 tablet 1   Multiple Vitamins-Minerals (MULTIVITAMIN WITH MINERALS) tablet Take 1 tablet by mouth daily.     Respiratory Therapy Supplies (NEBULIZER MASK ADULT) MISC Take 1 each by nebulization as directed. 1 each 1   sertraline (ZOLOFT) 50 MG tablet Take 50 mg by mouth at bedtime.     Spacer/Aero Chamber QUALCOMM  Use as directed. 1 each 0   budesonide (PULMICORT) 0.5 MG/2ML nebulizer solution Take 2 mLs (0.5 mg total) by nebulization 2 (two) times daily. Mix with Formoterol in nebulizer (Patient not taking: Reported on 03/29/2023) 120 mL 5   formoterol (PERFOROMIST) 20 MCG/2ML nebulizer solution Take 2 mLs (20 mcg total) by nebulization 2 (two) times daily. Mix Budesonide vial with Formoterol vial in nebulizer twice a day. Do not use Formoterol alone. (Patient not taking: Reported on 03/29/2023) 120 mL 5   ipratropium (ATROVENT) 0.06 % nasal spray 1-2 SPRAYS EACH NOSTRIL 1-2 TIMES A DAY AS NEEDED FOR NASAL CONGESTION OR DRAINAGE (Patient not taking: Reported on 03/29/2023) 45 mL 0   mometasone (NASONEX) 50 MCG/ACT nasal spray Place 2 sprays into the nose daily. (Patient not taking: Reported on 03/29/2023) 17 g 5   montelukast (SINGULAIR) 10 MG tablet TAKE 1 TABLET BY MOUTH EVERYDAY AT BEDTIME (Patient not taking: Reported on 03/29/2023) 90 tablet 0   norethindrone (AYGESTIN) 5 MG tablet Take 1 tablet (5 mg total) by mouth daily. 30 tablet 2   risperiDONE (RISPERDAL) 1 MG tablet TAKE 1 TABLET EVERY DAY (Patient not taking: Reported on 03/29/2023) 31 tablet 2   Current Facility-Administered Medications  Medication Dose Route Frequency Provider Last Rate Last Admin   albuterol (PROVENTIL) (2.5 MG/3ML) 0.083% nebulizer solution 2.5 mg  2.5 mg Nebulization Once        predniSONE (DELTASONE) tablet 30 mg  30 mg Oral Q breakfast          Known medication allergies: Allergies  Allergen Reactions   Shellfish-Derived Products Swelling   Cats Claw [Uncaria Tomentosa (Cats Claw)]    Dust Mite Extract    Other     TREES   Shellfish Allergy    Tree Extract      Physical examination: Blood pressure 102/72, pulse 88, temperature 98.4 F (36.9 C), temperature source Temporal, resp. rate 16, height 5\' 3"  (1.6 m), weight 138 lb 3.2 oz (62.7 kg), SpO2 94%.  General: Alert, interactive, appears unwell. HEENT: PERRLA,  TMs pearly gray, turbinates moderately edematous without discharge, post-pharynx non erythematous. Neck: Supple without lymphadenopathy. Lungs: Decreased breath sounds with expiratory wheezing bilaterally. {no increased work of breathing. CV: Normal S1, S2 without murmurs. Abdomen: Nondistended, nontender. Skin: Warm and dry, without lesions or rashes. Extremities:  No clubbing, cyanosis or edema. Neuro:   Grossly intact.  Diagnositics/Labs: Albuterol neb provided in office 30mg  prednisone give in office  Assessment and plan: Influenzal illness - tested positive to FluA on 03/23/23 however was not prescribed tamiflu at the time.  Unfortunately now outside window of using tamiflu.  - with continued respiratory symptoms will treat with Augmentmen 875mg  1 tab twice a day for 10 days - take prednisone 20mg  twice a day for next 4 days.  Next dose tonight.  - monitor for fevers.  Stay hydrated and drink plenty of fluids.  Get adequate rest  Asthma  - continue Budesonide  0.5mg  mixed with Formoterol nebulized twice daily (in morning and at night).  - Use albuterol rescue inhaler or nebulizer as needed, up to 2 puffs every 4-6 hours during an asthma exacerbation.  - Recommend using albuterol prior to activity/exercise.  Use the albuterol pump inhaler with spacer.  Use 2 puffs 15-20 minutes prior to exercise/activity  - Let us know if you are not meeting the following goals.   Asthma control goals:  Full participation in all desired activities (may need albuterol before activity) Albuterol use two time or less a week on average (not counting use with activity) Cough interfering with sleep two time or less a month Oral steroids no more than once a year No hospitalizations  Allergic rhinitis - Continue Xyzal 5mg  daily during the week.  If needed can take extra dose in the AM.   - Use nasal Atrovent 1-2 sprays each nostril 1-2 times a day for nasal congestion or drainage - Use nasal saline spray  daily to help decrease risk of nosebleeds - Continue montelukast 10 mg daily - Use Cromolyn 1-2 drop each eye daily up to 3-4 times a day as needed for itchy/watery eyes. - Continue to use dust covers for dust mite control  Food allergies  - Continue to avoid all shellfish - Have access to your EpiPen at all times, and follow your emergency action plan.  Seborrheic dermatitis - recommend use ketoconzole shampoo 1-2 times a week - try to avoid heavy oiling of the scalp   Follow up in 6 months or sooner if needed.    No follow-ups on file.  I appreciate the opportunity to take part in Mulan's care. Please do not hesitate to contact me with questions.  Sincerely,   Margo Aye, MD Allergy/Immunology [facility]

## 2023-03-29 NOTE — Patient Instructions (Addendum)
Influenzal illness - tested positive to FluA on 03/23/23 however was not prescribed tamiflu at the time.  Unfortunately now outside window of using tamiflu.  - with continued respiratory symptoms will treat with Augmentmen 875mg  1 tab twice a day for 10 days - take prednisone 20mg  twice a day for next 4 days.  Next dose tonight.  - monitor for fevers.  Stay hydrated and drink plenty of fluids.  Get adequate rest  Asthma  - continue Budesonide 0.5mg  mixed with Formoterol nebulized twice daily (in morning and at night).  - Use albuterol rescue inhaler or nebulizer as needed, up to 2 puffs every 4-6 hours during an asthma exacerbation.  - Recommend using albuterol prior to activity/exercise.  Use the albuterol pump inhaler with spacer.  Use 2 puffs 15-20 minutes prior to exercise/activity  - Let us know if you are not meeting the following goals.   Asthma control goals:  Full participation in all desired activities (may need albuterol before activity) Albuterol use two time or less a week on average (not counting use with activity) Cough interfering with sleep two time or less a month Oral steroids no more than once a year No hospitalizations  Allergic rhinitis - Continue Xyzal 5mg  daily during the week.  If needed can take extra dose in the AM.   - Use nasal Atrovent 1-2 sprays each nostril 1-2 times a day for nasal congestion or drainage - Use nasal saline spray daily to help decrease risk of nosebleeds - Continue montelukast 10 mg daily - Use Cromolyn 1-2 drop each eye daily up to 3-4 times a day as needed for itchy/watery eyes. - Continue to use dust covers for dust mite control  Food allergies  - Continue to avoid all shellfish - Have access to your EpiPen at all times, and follow your emergency action plan.  Seborrheic dermatitis - recommend use ketoconzole shampoo 1-2 times a week - try to avoid heavy oiling of the scalp   Follow up in 6 months or sooner if needed.

## 2023-03-30 ENCOUNTER — Other Ambulatory Visit: Payer: Self-pay

## 2023-03-30 ENCOUNTER — Ambulatory Visit: Payer: MEDICAID | Admitting: Physician Assistant

## 2023-03-30 VITALS — BP 112/74 | HR 81 | Temp 97.7°F | Ht 63.0 in | Wt 136.6 lb

## 2023-03-30 DIAGNOSIS — I340 Nonrheumatic mitral (valve) insufficiency: Secondary | ICD-10-CM

## 2023-03-30 DIAGNOSIS — F79 Unspecified intellectual disabilities: Secondary | ICD-10-CM | POA: Diagnosis not present

## 2023-03-30 DIAGNOSIS — Z3009 Encounter for other general counseling and advice on contraception: Secondary | ICD-10-CM

## 2023-03-30 DIAGNOSIS — J111 Influenza due to unidentified influenza virus with other respiratory manifestations: Secondary | ICD-10-CM

## 2023-03-30 DIAGNOSIS — F39 Unspecified mood [affective] disorder: Secondary | ICD-10-CM

## 2023-03-30 NOTE — Assessment & Plan Note (Signed)
Follows with cardiology - repeat ECHO scheduled for May this year  No symptoms per patient

## 2023-03-30 NOTE — Assessment & Plan Note (Signed)
Follows with private psychiatrist Waiting on eval report to be finalized from [facility] Mom looking into day programs for adults

## 2023-03-30 NOTE — Assessment & Plan Note (Signed)
Follows with psych Chronic, stable Continues Risperdal 0.5 mg at bedtime & Zoloft 50 mg daily

## 2023-03-30 NOTE — Progress Notes (Signed)
Patient ID: Lauren Cain, female    DOB: 2000-11-15, 23 y.o.   MRN: [id number]   Assessment & Plan:  Mood disorder [facility]) Assessment & Plan: Follows with psych Chronic, stable Continues Risperdal 0.5 mg at bedtime & Zoloft 50 mg daily    Intellectual disability Assessment & Plan: Follows with private psychiatrist Waiting on eval report to be finalized from [facility] Mom looking into day programs for adults    Mitral valve insufficiency, unspecified etiology Assessment & Plan: Follows with cardiology - repeat ECHO scheduled for May this year  No symptoms per patient    Flu  --Overcoming flu virus, feeling better today. Will cont breathing tx's, amoxicillin, prednisone as given by her asthma / allergist.       Return in about 1 year (around 03/29/2024) for physical.    Subjective:    Chief Complaint  Patient presents with   Medical Management of Chronic Issues    Patient is present for Medical Management of Chronic issues. Patient has been sick with flu. Treating for pneum with abx.    HPI Patient is in today for 6 mo regular f/up. Here with mom, Lauren Cain, today.  Diagnosed with Flu on 03/23/23.  Seen at asthma / allergy yesterday. Added prednisone and amoxicillin.  Feeling "good" today per patient.  Waiting for reports / evaluation from [facility], but doctor writing eval is in the hospital. Expected to be done by March. Needs reports for Medicaid.   Still following with psych dr who was working at [facility] previously, now has own practice. Mom to get her name for [place]. Still stable on Zoloft and Risperdal.  ECHO has been reschedule to May per mom.    Past Medical History:  Diagnosis Date   Abnormal weight loss    Asthma    Dysmenorrhea    Eczema    Mental impairment    Mitral valve prolapse    URI (upper respiratory infection)     Past Surgical History:  Procedure Laterality Date   MOUTH SURGERY     x 2   TEAR DUCT PROBING Bilateral     TOENAIL EXCISION     removal    Family History  Problem Relation Age of Onset   Asthma Mother    Allergic rhinitis Mother    Obesity Mother    Allergic rhinitis Father    Prostate cancer Maternal Grandfather    Kidney disease Maternal Grandfather    Atrial fibrillation Maternal Grandfather     Social History   Tobacco Use   Smoking status: Never   Smokeless tobacco: Never  Vaping Use   Vaping status: Never Used  Substance Use Topics   Alcohol use: Not Currently   Drug use: Never     Allergies  Allergen Reactions   Shellfish-Derived Products Swelling   Cats Claw [Uncaria Tomentosa (Cats Claw)]    Dust Mite Extract    Other     TREES   Shellfish Allergy    Tree Extract     Review of Systems NEGATIVE UNLESS OTHERWISE INDICATED IN HPI      Objective:     BP 112/74 (BP Location: Left Arm, Patient Position: Sitting)   Pulse 81   Temp 97.7 F (36.5 C) (Temporal)   Ht 5\' 3"  (1.6 m)   Wt 136 lb 9.6 oz (62 kg)   SpO2 97%   BMI 24.20 kg/m   Wt Readings from Last 3 Encounters:  03/30/23 136 lb 9.6 oz (62 kg)  03/29/23 138 lb 3.2 oz (62.7 kg)  03/23/23 133 lb 6.1 oz (60.5 kg)    BP Readings from Last 3 Encounters:  03/30/23 112/74  03/29/23 102/72  03/23/23 (!) 141/70     Physical Exam Vitals and nursing note reviewed.  Constitutional:      Appearance: Normal appearance. She is normal weight. She is not toxic-appearing.  HENT:     Head: Normocephalic and atraumatic.     Right Ear: Tympanic membrane, ear canal and external ear normal.     Left Ear: Tympanic membrane, ear canal and external ear normal.     Nose: Nose normal.     Mouth/Throat:     Mouth: Mucous membranes are moist.  Eyes:     Extraocular Movements: Extraocular movements intact.     Conjunctiva/sclera: Conjunctivae normal.     Pupils: Pupils are equal, round, and reactive to light.  Cardiovascular:     Rate and Rhythm: Normal rate and regular rhythm.     Pulses: Normal pulses.      Heart sounds: Murmur heard.  Pulmonary:     Effort: Pulmonary effort is normal.     Breath sounds: Normal breath sounds.  Abdominal:     Palpations: Abdomen is soft.  Musculoskeletal:        General: Normal range of motion.     Cervical back: Normal range of motion and neck supple.  Skin:    General: Skin is warm and dry.  Neurological:     General: No focal deficit present.     Mental Status: She is alert and oriented to person, place, and time.  Psychiatric:        Mood and Affect: Mood normal.        Behavior: Behavior normal.         Lauren Victoria M Issiac Jamar, PA-C

## 2023-04-03 ENCOUNTER — Other Ambulatory Visit (HOSPITAL_COMMUNITY): Payer: MEDICAID

## 2023-04-05 ENCOUNTER — Other Ambulatory Visit: Payer: Self-pay | Admitting: Physician Assistant

## 2023-04-05 DIAGNOSIS — N921 Excessive and frequent menstruation with irregular cycle: Secondary | ICD-10-CM

## 2023-05-01 ENCOUNTER — Other Ambulatory Visit: Payer: Self-pay | Admitting: Allergy

## 2023-05-02 NOTE — Telephone Encounter (Signed)
 Performist 20 mcg/22mL nebulizer solution is on back order. Please advise different medication (per pharmacy)

## 2023-05-11 ENCOUNTER — Other Ambulatory Visit: Payer: Self-pay

## 2023-05-11 MED ORDER — ARFORMOTEROL TARTRATE 15 MCG/2ML IN NEBU: 15.0000 ug | INHALATION_SOLUTION | Freq: Two times a day (BID) | RESPIRATORY_TRACT | 3 refills | Status: AC

## 2023-06-22 ENCOUNTER — Encounter: Payer: Self-pay | Admitting: Physician Assistant

## 2023-06-22 ENCOUNTER — Telehealth: Payer: Self-pay | Admitting: Allergy

## 2023-06-22 ENCOUNTER — Ambulatory Visit (INDEPENDENT_AMBULATORY_CARE_PROVIDER_SITE_OTHER): Payer: MEDICAID | Admitting: Physician Assistant

## 2023-06-22 VITALS — BP 118/78 | HR 109 | Temp 98.4°F | Ht 63.0 in | Wt 141.6 lb

## 2023-06-22 DIAGNOSIS — R5383 Other fatigue: Secondary | ICD-10-CM

## 2023-06-22 DIAGNOSIS — E559 Vitamin D deficiency, unspecified: Secondary | ICD-10-CM

## 2023-06-22 DIAGNOSIS — J309 Allergic rhinitis, unspecified: Secondary | ICD-10-CM | POA: Diagnosis not present

## 2023-06-22 DIAGNOSIS — L608 Other nail disorders: Secondary | ICD-10-CM | POA: Diagnosis not present

## 2023-06-22 LAB — COMPREHENSIVE METABOLIC PANEL WITH GFR
ALT: 14 U/L (ref 0–35)
AST: 13 U/L (ref 0–37)
Albumin: 4.8 g/dL (ref 3.5–5.2)
Alkaline Phosphatase: 83 U/L (ref 39–117)
BUN: 9 mg/dL (ref 6–23)
CO2: 22 meq/L (ref 19–32)
Calcium: 9.4 mg/dL (ref 8.4–10.5)
Chloride: 106 meq/L (ref 96–112)
Creatinine, Ser: 0.56 mg/dL (ref 0.40–1.20)
GFR: 129.55 mL/min (ref >60.00)
Glucose, Bld: 100 mg/dL — ABNORMAL HIGH (ref 70–99)
Potassium: 3.5 meq/L (ref 3.5–5.1)
Sodium: 138 meq/L (ref 135–145)
Total Bilirubin: 1.1 mg/dL (ref 0.2–1.2)
Total Protein: 8 g/dL (ref 6.0–8.3)

## 2023-06-22 LAB — CBC WITH DIFFERENTIAL/PLATELET
Basophils Absolute: 0 10*3/uL (ref 0.0–0.1)
Basophils Relative: 0.8 % (ref 0.0–3.0)
Eosinophils Absolute: 0.1 10*3/uL (ref 0.0–0.7)
Eosinophils Relative: 1.8 % (ref 0.0–5.0)
HCT: 40.9 % (ref 36.0–46.0)
Hemoglobin: 13.8 g/dL (ref 12.0–15.0)
Lymphocytes Relative: 31.9 % (ref 12.0–46.0)
Lymphs Abs: 1.8 10*3/uL (ref 0.7–4.0)
MCHC: 33.8 g/dL (ref 30.0–36.0)
MCV: 83.9 fl (ref 78.0–100.0)
Monocytes Absolute: 0.7 10*3/uL (ref 0.1–1.0)
Monocytes Relative: 11.8 % (ref 3.0–12.0)
Neutro Abs: 3 10*3/uL (ref 1.4–7.7)
Neutrophils Relative %: 53.7 % (ref 43.0–77.0)
Platelets: 293 10*3/uL (ref 150.0–400.0)
RBC: 4.87 Mil/uL (ref 3.87–5.11)
RDW: 13.8 % (ref 11.5–15.5)
WBC: 5.5 10*3/uL (ref 4.0–10.5)

## 2023-06-22 LAB — IBC + FERRITIN
Ferritin: 54.3 ng/mL (ref 10.0–291.0)
Iron: 83 ug/dL (ref 42–145)
Saturation Ratios: 20.6 % (ref 20.0–50.0)
TIBC: 403.2 ug/dL (ref 250.0–450.0)
Transferrin: 288 mg/dL (ref 212.0–360.0)

## 2023-06-22 LAB — TSH: TSH: 1.13 u[IU]/mL (ref 0.35–5.50)

## 2023-06-22 LAB — VITAMIN D 25 HYDROXY (VIT D DEFICIENCY, FRACTURES): VITD: 33.03 ng/mL (ref 30.00–100.00)

## 2023-06-22 LAB — HEMOGLOBIN A1C: Hgb A1c MFr Bld: 5.3 % (ref 4.6–6.5)

## 2023-06-22 NOTE — Telephone Encounter (Signed)
 Spoke with Mom--DOB verified--she was able to schedule an appointment with the PCP in regards to the dark spots. Told her to keep us  updated on what the PCP says. Dr. Tempie Fee is aware.

## 2023-06-22 NOTE — Progress Notes (Signed)
 Patient ID: Lauren Cain, female    DOB: 2000/03/10, 23 y.o.   MRN: [id number]   Assessment & Plan:  Change in nail appearance -     CBC with Differential/Platelet -     IBC + Ferritin -     Comprehensive metabolic panel with GFR -     TSH -     Hemoglobin A1c -     VITAMIN D  25 Hydroxy (Vit-D Deficiency, Fractures) -     Ambulatory referral to Podiatry  Allergic shiners -     CBC with Differential/Platelet -     IBC + Ferritin -     Comprehensive metabolic panel with GFR -     TSH -     Hemoglobin A1c -     VITAMIN D  25 Hydroxy (Vit-D Deficiency, Fractures)  Vitamin D  deficiency -     VITAMIN D  25 Hydroxy (Vit-D Deficiency, Fractures)  Toenail deformity -     CBC with Differential/Platelet -     IBC + Ferritin -     Comprehensive metabolic panel with GFR -     TSH -     Hemoglobin A1c -     VITAMIN D  25 Hydroxy (Vit-D Deficiency, Fractures) -     Ambulatory referral to Podiatry  Other fatigue -     CBC with Differential/Platelet -     IBC + Ferritin -     Comprehensive metabolic panel with GFR -     TSH -     Hemoglobin A1c -     VITAMIN D  25 Hydroxy (Vit-D Deficiency, Fractures)      Assessment and Plan Assessment & Plan  Darkening of the eyes and nails, increased fatigue, and family history could suggest early signs of diabetes.  - Order A1c to assess for diabetes - Order comprehensive metabolic panel to evaluate liver, kidney, and electrolytes - Order thyroid function tests  Iron deficiency Darkening of the eyes and nails, along with increased fatigue, may indicate iron deficiency. She denies pica symptoms such as craving ice. - Order iron studies to assess for iron deficiency  Vitamin D  deficiency Currently taking vitamin D  supplements. - Order vitamin D  level to assess current status  Nail discoloration Nail discoloration and changes in the feet may indicate a fungal infection, although a previous biopsy in 2020 was negative. Referral to  podiatry is planned for further evaluation and management. Podiatry may treat for fungal infection if present, as pedicures only provide temporary cosmetic improvement. - Refer to podiatry for evaluation of nail discoloration and possible fungal infection - Advise use of thick moisturizer on feet at night with cotton socks      No follow-ups on file.    Subjective:    Chief Complaint  Patient presents with   Eye Problem    Pt in office c/o dark circles around the eyes that's been present for the past month, also states pt toe nails are starting to turn black;     Eye Problem    Discussed the use of AI scribe software for clinical note transcription with the patient, who gave verbal consent to proceed.  History of Present Illness Lauren Cain is a 23 year old female who presents with dark discoloration around her eyes and nails. She is accompanied by her mother.  She has experienced progressive dark discoloration around her eyes and nails over the past month. The periorbital discoloration is more pronounced at night. She underwent surgery as a child for a  balloon under her eye related to her tear ducts, though its relevance to her current symptoms is unclear.  Her nails, especially on her toes, have also darkened. 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 The nail discoloration coincides with the changes around her eyes.  No itching of the eyes, sniffles, or congestion. She has not been craving ice. She reports increased fatigue and has been sleeping more than usual.  Her past medical history includes vitamin D  deficiency, for which she takes vitamin D  supplements along with vitamin C, calcium , and multivitamins. She has not been officially checked for other deficiencies recently, but her mother recalls a bone marrow test indicating low levels, leading to her current supplementation.  Family history  includes her father's similar nail changes, which were associated with diabetes, although he initially denied having 'sugar'.     Past Medical History:  Diagnosis Date   Abnormal weight loss    Asthma    Dysmenorrhea    Eczema    Mental impairment    Mitral valve prolapse    URI (upper respiratory infection)     Past Surgical History:  Procedure Laterality Date   MOUTH SURGERY     x 2   TEAR DUCT PROBING Bilateral    TOENAIL EXCISION     removal    Family History  Problem Relation Age of Onset   Asthma Mother    Allergic rhinitis Mother    Obesity Mother    Allergic rhinitis Father    Prostate cancer Maternal Grandfather    Kidney disease Maternal Grandfather    Atrial fibrillation Maternal Grandfather     Social History   Tobacco Use   Smoking status: Never   Smokeless tobacco: Never  Vaping Use   Vaping status: Never Used  Substance Use Topics   Alcohol use: Not Currently   Drug use: Never     Allergies  Allergen Reactions   Shellfish-Derived Products Swelling   Cats Claw [Uncaria Tomentosa (Cats Claw)]    Dust Mite Extract    Other     TREES   Shellfish Allergy    Tree Extract     Review of Systems NEGATIVE UNLESS OTHERWISE INDICATED IN HPI      Objective:     BP 118/78 (BP Location: Left Arm, Patient Position: Sitting, Cuff Size: Normal)   Pulse (!) 109   Temp 98.4 F (36.9 C) (Temporal)   Ht 5\' 3"  (1.6 m)   Wt 141 lb 9.6 oz (64.2 kg)   SpO2 97%   BMI 25.08 kg/m   Wt Readings from Last 3 Encounters:  06/22/23 141 lb 9.6 oz (64.2 kg)  03/30/23 136 lb 9.6 oz (62 kg)  03/29/23 138 lb 3.2 oz (62.7 kg)    BP Readings from Last 3 Encounters:  06/22/23 118/78  03/30/23 112/74  03/29/23 102/72     Physical Exam Vitals and nursing note reviewed.  Constitutional:      Appearance: Normal appearance.  Eyes:     Extraocular Movements: Extraocular movements intact.     Conjunctiva/sclera: Conjunctivae normal.     Pupils: Pupils are  equal, round, and reactive to light.  Skin:    Comments: Abnormally thickened dark toenails with thickened skin of toes  Macular dark pigmentation surrounding both eyes, worse on upper eyelids   Rest of skin exam normal   Neurological:     General: No focal deficit present.  Mental Status: She is alert and oriented to person, place, and time.  Psychiatric:        Mood and Affect: Mood normal.        Behavior: Behavior normal.             Pearlina Friedly M Maelys Kinnick, PA-C

## 2023-06-22 NOTE — Telephone Encounter (Signed)
 Pt mother called due to severe allergies causing dark circles around pt's eyes - mom would like call to discuss rx, declined making office appt

## 2023-06-25 ENCOUNTER — Ambulatory Visit (HOSPITAL_COMMUNITY): Payer: MEDICAID

## 2023-06-25 ENCOUNTER — Ambulatory Visit (HOSPITAL_COMMUNITY): Payer: MEDICAID | Attending: Internal Medicine | Admitting: Internal Medicine

## 2023-06-25 ENCOUNTER — Encounter: Payer: Self-pay | Admitting: Internal Medicine

## 2023-06-25 ENCOUNTER — Ambulatory Visit: Payer: MEDICAID

## 2023-06-25 VITALS — BP 106/68 | HR 83 | Ht 63.0 in | Wt 141.2 lb

## 2023-06-25 DIAGNOSIS — I340 Nonrheumatic mitral (valve) insufficiency: Secondary | ICD-10-CM

## 2023-06-25 DIAGNOSIS — R002 Palpitations: Secondary | ICD-10-CM | POA: Diagnosis not present

## 2023-06-25 DIAGNOSIS — I341 Nonrheumatic mitral (valve) prolapse: Secondary | ICD-10-CM

## 2023-06-25 LAB — ECHOCARDIOGRAM COMPLETE
Area-P 1/2: 6.02 cm2
S' Lateral: 2.93 cm

## 2023-06-25 NOTE — Patient Instructions (Signed)
 Medication Instructions:  Your physician recommends that you continue on your current medications as directed. Please refer to the Current Medication list given to you today.  *If you need a refill on your cardiac medications before your next appointment, please call your pharmacy*  Lab Work: NONE  If you have labs (blood work) drawn today and your tests are completely normal, you will receive your results only by: MyChart Message (if you have MyChart) OR A paper copy in the mail If you have any lab test that is abnormal or we need to change your treatment, we will call you to review the results.  Testing/Procedures: APRIL 2026- - - Your physician has requested that you have an echocardiogram. Echocardiography is a painless test that uses sound waves to create images of your heart. It provides your doctor with information about the size and shape of your heart and how well your heart's chambers and valves are working. This procedure takes approximately one hour. There are no restrictions for this procedure. Please do NOT wear cologne, perfume, aftershave, or lotions (deodorant is allowed). Please arrive 15 minutes prior to your appointment time.  Please note: We ask at that you not bring children with you during ultrasound (echo/ vascular) testing. Due to room size and safety concerns, children are not allowed in the ultrasound rooms during exams. Our front office staff cannot provide observation of children in our lobby area while testing is being conducted. An adult accompanying a patient to their appointment will only be allowed in the ultrasound room at the discretion of the ultrasound technician under special circumstances. We apologize for any inconvenience.   Your physician has requested that you wear a heart monitor.  Follow-Up: At [facility], you and your health needs are our priority.  As part of our continuing mission to provide you with exceptional heart care, our  providers are all part of one team.  This team includes your primary Cardiologist (physician) and Advanced Practice Providers or APPs (Physician Assistants and Nurse Practitioners) who all work together to provide you with the care you need, when you need it.  Your next appointment:   12 month(s)  Provider:   Jann Melody, MD    We recommend signing up for the patient portal called "MyChart".  Sign up information is provided on this After Visit Summary.  MyChart is used to connect with patients for Virtual Visits (Telemedicine).  Patients are able to view lab/test results, encounter notes, upcoming appointments, etc.  Non-urgent messages can be sent to your provider as well.   To learn more about what you can do with MyChart, go to [email or web address].   Other Instructions ZIO XT- Long Term Monitor Instructions  Your physician has requested you wear a ZIO patch monitor for 7 days.  This is a single patch monitor. [employer] supplies one patch monitor per enrollment. Additional stickers are not available. Please do not apply patch if you will be having a Nuclear Stress Test,   Cardiac CT, MRI, or Chest Xray during the period you would be wearing the  monitor. The patch cannot be worn during these tests. You cannot remove and re-apply the  ZIO XT patch monitor.  Your ZIO patch monitor will be mailed 3 day USPS to your address on file. It may take 3-5 days  to receive your monitor after you have been enrolled.  Once you have received your monitor, please review the enclosed instructions. Your monitor  has already been registered assigning a  specific monitor serial # to you.  Billing and Patient Assistance Program Information  We have supplied [employer] with any of your insurance information on file for billing purposes. [employer] offers a sliding scale Patient Assistance Program for patients that do not have  insurance, or whose insurance does not completely cover the cost of the ZIO  monitor.  You must apply for the Patient Assistance Program to qualify for this discounted rate.  To apply, please call [employer] at [phone], select option 4, select option 2, ask to apply for  Patient Assistance Program. Sanna Crystal will ask your household income, and how many people  are in your household. They will quote your out-of-pocket cost based on that information.  [employer] will also be able to set up a 8-month, interest-free payment plan if needed.  Applying the monitor   Shave hair from upper left chest.  Hold abrader disc by orange tab. Rub abrader in 40 strokes over the upper left chest as  indicated in your monitor instructions.  Clean area with 4 enclosed alcohol pads. Let dry.  Apply patch as indicated in monitor instructions. Patch will be placed under collarbone on left  side of chest with arrow pointing upward.  Rub patch adhesive wings for 2 minutes. Remove white label marked "1". Remove the white  label marked "2". Rub patch adhesive wings for 2 additional minutes.  While looking in a mirror, press and release button in center of patch. A small green light will  flash 3-4 times. This will be your only indicator that the monitor has been turned on.  Do not shower for the first 24 hours. You may shower after the first 24 hours.  Press the button if you feel a symptom. You will hear a small click. Record Date, Time and  Symptom in the Patient Logbook.  When you are ready to remove the patch, follow instructions on the last 2 pages of Patient  Logbook. Stick patch monitor onto the last page of Patient Logbook.  Place Patient Logbook in the blue and white box. Use locking tab on box and tape box closed  securely. The blue and white box has prepaid postage on it. Please place it in the mailbox as  soon as possible. Your physician should have your test results approximately 7 days after the  monitor has been mailed back to [facility].  Call [facility] Customer Care at  [phone] if you have questions regarding  your ZIO XT patch monitor. Call them immediately if you see an orange light blinking on your  monitor.  If your monitor falls off in less than 4 days, contact our Monitor department at [phone].  If your monitor becomes loose or falls off after 4 days call [employer] at [phone] for  suggestions on securing your monitor

## 2023-06-25 NOTE — Progress Notes (Signed)
 Cardiology Office Note:  .    Date:  06/25/2023  ID:  Lauren Cain, DOB January 21, 2001, MRN [id number] PCP: Cain, Lauren Felix, PA-C  [facility] Providers Cardiologist:  Jann Melody, MD     CC: Follow up MR  History of Present Illness: .    Lauren Cain is a 23 year old female with mitral valve prolapse and moderate mitral regurgitation who presents for follow-up.  She has a history of mitral valve prolapse with moderate mitral regurgitation. Her last echocardiogram was conducted in 2023, but she missed her 2024 follow-up. She is here to assess the current status of her mitral valve condition.  She experiences occasional shortness of breath, particularly after eating dinner and during exercise. No leg swelling is noted, but she does report that her feet sometimes swell. She also experiences palpitations, described as 'funny heartbeats,' which have been occurring since last week, as confirmed by her mother.  She has a known history of asthma, which is currently well-controlled. Her breathing is good, and she denies any issues with bowel function.  Family history is significant for mitral valve issues, as her uncle underwent open-heart surgery for a similar condition.   Relevant histories: .  Social- comes with mother; Father had prior open heart surgery ROS: As per HPI.   Studies Reviewed: .     Cardiac Studies & Procedures   ______________________________________________________________________________________________     ECHOCARDIOGRAM  ECHOCARDIOGRAM COMPLETE 06/25/2023  Narrative ECHOCARDIOGRAM REPORT    Patient Name:   Lauren Cain Date of Exam: 06/25/2023 Medical Rec #:  [id number]      Height:       63.0 in Accession #:    [id number]     Weight:       141.6 lb Date of Birth:  23-Sep-2000      BSA:          1.670 m Patient Age:    22 years       BP:           118/78 mmHg Patient Gender: F              HR:           84 bpm. Exam Location:   [address]  Procedure: 2D Echo, 3D Echo and Strain Analysis (Both Spectral and Color Flow Doppler were utilized during procedure).  Indications:    I34.0 Nonrheumatic mitral (valve) insufficiency  History:        Patient has prior history of Echocardiogram examinations, most recent 12/13/2021. Mitral Valve Prolapse; Risk Factors:Family History of Coronary Artery Disease. Asthma.  Sonographer:    Mylinda Asa RCS Referring Phys: [id number] [facility] A Ayaan Ringle  IMPRESSIONS   1. Left ventricular ejection fraction, by estimation, is 55 to 60%. Left ventricular ejection fraction by 3D volume is 58 %. The left ventricle has normal function. The left ventricle has no regional wall motion abnormalities. Left ventricular diastolic parameters were normal. The average left ventricular global longitudinal strain is -21.8 %. The global longitudinal strain is normal. 2. Right ventricular systolic function is normal. The right ventricular size is normal. 3. The mitral valve is myxomatous. Mild to moderate mitral valve regurgitation. No evidence of mitral stenosis. Bileaflet prolapse wtih late systolic MR 4. The aortic valve was not well visualized. Aortic valve regurgitation is not visualized. No aortic stenosis is present. 5. The inferior vena cava is normal in size with greater than 50% respiratory variability, suggesting right atrial pressure of 3 mmHg.  FINDINGS  Left Ventricle: Left ventricular ejection fraction, by estimation, is 55 to 60%. Left ventricular ejection fraction by 3D volume is 58 %. The left ventricle has normal function. The left ventricle has no regional wall motion abnormalities. The average left ventricular global longitudinal strain is -21.8 %. Strain was performed and the global longitudinal strain is normal. The left ventricular internal cavity size was normal in size. There is no left ventricular hypertrophy. Left ventricular diastolic parameters were normal.  Right  Ventricle: The right ventricular size is normal. No increase in right ventricular wall thickness. Right ventricular systolic function is normal.  Left Atrium: Left atrial size was normal in size.  Right Atrium: Right atrial size was normal in size.  Pericardium: There is no evidence of pericardial effusion.  Mitral Valve: The mitral valve is myxomatous. Mild to moderate mitral valve regurgitation. No evidence of mitral valve stenosis.  Tricuspid Valve: The tricuspid valve is normal in structure. Tricuspid valve regurgitation is trivial.  Aortic Valve: The aortic valve was not well visualized. Aortic valve regurgitation is not visualized. No aortic stenosis is present.  Pulmonic Valve: The pulmonic valve was grossly normal. Pulmonic valve regurgitation is trivial.  Aorta: The aortic root and ascending aorta are structurally normal, with no evidence of dilitation.  Venous: The inferior vena cava is normal in size with greater than 50% respiratory variability, suggesting right atrial pressure of 3 mmHg.  IAS/Shunts: The interatrial septum was not well visualized.  Additional Comments: 3D was performed not requiring image post processing on an independent workstation and was normal.   LEFT VENTRICLE PLAX 2D LVIDd:         4.68 cm         Diastology LVIDs:         2.93 cm         LV e' medial:    8.16 cm/s LV PW:         0.86 cm         LV E/e' medial:  9.2 LV IVS:        0.75 cm         LV e' lateral:   13.10 cm/s LVOT diam:     2.30 cm         LV E/e' lateral: 5.7 LV SV:         61 LV SV Index:   37              2D Longitudinal LVOT Area:     4.15 cm        Strain 2D Strain GLS   -23.8 % (A4C): 2D Strain GLS   -22.1 % (A3C): 2D Strain GLS   -19.4 % (A2C): 2D Strain GLS   -21.8 % Avg:  3D Volume EF LV 3D EF:    Left ventricul ar ejection fraction by 3D volume is 58 %.  3D Volume EF: 3D EF:        58 % LV EDV:       159 ml LV ESV:       67 ml LV SV:        92  ml  RIGHT VENTRICLE RV Basal diam:  3.54 cm RV S prime:     14.80 cm/s TAPSE (M-mode): 2.4 cm  LEFT ATRIUM             Index        RIGHT ATRIUM          Index LA diam:  3.70 cm 2.22 cm/m   RA Area:     8.54 cm LA Vol (A2C):   38.5 ml 23.06 ml/m  RA Volume:   15.20 ml 9.10 ml/m LA Vol (A4C):   31.0 ml 18.57 ml/m LA Biplane Vol: 36.6 ml 21.92 ml/m AORTIC VALVE LVOT Vmax:   77.20 cm/s LVOT Vmean:  53.500 cm/s LVOT VTI:    0.148 m  AORTA Ao Root diam: 3.00 cm Ao Asc diam:  2.10 cm  MITRAL VALVE MV Area (PHT): 6.02 cm    SHUNTS MV Decel Time: 126 msec    Systemic VTI:  0.15 m MV E velocity: 74.80 cm/s  Systemic Diam: 2.30 cm MV A velocity: 65.10 cm/s MV E/A ratio:  1.15  Carson Clara MD Electronically signed by Carson Clara MD Signature Date/Time: 06/25/2023/3:54:02 PM    Final          ______________________________________________________________________________________________        Physical Exam:    VS:  BP 106/68 (BP Location: Left Arm, Patient Position: Sitting, Cuff Size: Normal)   Pulse 83   Ht 5\' 3"  (1.6 m)   Wt 141 lb 3.2 oz (64 kg)   SpO2 99%   BMI 25.01 kg/m    Wt Readings from Last 3 Encounters:  06/25/23 141 lb 3.2 oz (64 kg)  06/22/23 141 lb 9.6 oz (64.2 kg)  03/30/23 136 lb 9.6 oz (62 kg)    Gen: no distress,   Neck: No JVD Cardiac: No Rubs or Gallops, systolic murmur, Rare irregular heart beat, +2 radial pulses Respiratory: Clear to auscultation bilaterally, normal effort, normal  respiratory rate GI: Soft, nontender, non-distended  MS: No  edema;  moves all extremities Integument: Skin feels warm Neuro:  At time of evaluation, alert and oriented to person/place/time/situation  Psych: Normal affect, patient feels ok   ASSESSMENT AND PLAN: .     An EKG was ordered for MR and shows no PVCs  Mitral valve prolapse with moderate mitral regurgitation Mitral valve prolapse with moderate mitral  regurgitation confirmed by echocardiogram. The condition is well-managed with no significant changes since 2023. Heart function remains normal. 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 No immediate surgical intervention is required. Discussed potential future surgical repair if the valve becomes severely leaky or if heart function deteriorates, emphasizing repair over replacement. Surgery, if needed, would be minimally invasive and performed at a specialized center such as [facility], [place] or [facility] due to the retirement of the local mitral Brewing technologist. - Order echocardiogram in one year - Monitor for symptoms of heart failure such as dyspnea and peripheral edema - Consider surgical referral if mitral regurgitation becomes severe or heart function declines  Palpitations. - Order one week Zio patch for palpitations  Asthma Asthma is well-controlled with no acute exacerbations. Breathing is generally good, with occasional dyspnea during meals and exercise.  One year with me or my team after echo  Gloriann Larger, MD FASE [facility] Cardiologist [facility]  [address] [place], [address] [place], [place] [address] [phone]  5:15 PM

## 2023-06-25 NOTE — Progress Notes (Unsigned)
 Enrolled for [employer] to mail a ZIO XT long term holter monitor to [address] [place], [place]  [address].

## 2023-06-26 ENCOUNTER — Ambulatory Visit: Payer: Self-pay | Admitting: Physician Assistant

## 2023-07-03 ENCOUNTER — Ambulatory Visit (INDEPENDENT_AMBULATORY_CARE_PROVIDER_SITE_OTHER): Payer: MEDICAID | Admitting: Podiatry

## 2023-07-03 DIAGNOSIS — M2141 Flat foot [pes planus] (acquired), right foot: Secondary | ICD-10-CM | POA: Diagnosis not present

## 2023-07-03 DIAGNOSIS — M2142 Flat foot [pes planus] (acquired), left foot: Secondary | ICD-10-CM

## 2023-07-03 DIAGNOSIS — B351 Tinea unguium: Secondary | ICD-10-CM | POA: Diagnosis not present

## 2023-07-03 MED ORDER — CICLOPIROX 8 % EX SOLN
Freq: Every day | CUTANEOUS | 2 refills | Status: AC
Start: 2023-07-03 — End: ?

## 2023-07-03 NOTE — Patient Instructions (Addendum)
 [facility] for inserts: Address: [address] [place], [place] [address] Phone: [phone]  --  Ciclopirox Topical Solution What is this medication? CICLOPIROX (sye kloe PEER ox) treats fungal infections of the nails. It belongs to a group of medications called antifungals. It will not treat infections caused by bacteria or viruses. This medicine may be used for other purposes; ask your health care provider or pharmacist if you have questions. COMMON BRAND NAME(S): Ciclodan Nail Solution, CNL8, Penlac What should I tell my care team before I take this medication? They need to know if you have any of these conditions: Diabetes (high blood sugar) Immune system problems Organ transplant Receiving steroid inhalers, cream, or lotion Seizures Tingling of the fingers or toes or other nerve disorder An unusual or allergic reaction to ciclopirox, other medications, foods, dyes, or preservatives Pregnant or trying to get pregnant Breast-feeding How should I use this medication? This medication is for external use only. Do not take by mouth. Wash your hands before and after use. If you are treating your hands, only wash your hands before use. Do not get it in your eyes. If you do, rinse your eyes with plenty of cool tap water. Use it as directed on the prescription label at the same time every day. Do not use it more often than directed. Use the medication for the full course as directed by your care team, even if you think you are better. Do not stop using it unless your care team tells you to stop it early. Apply a thin film of the medication to the affected area. Talk to your care team about the use of this medication in children. While it may be prescribed for children as young as 12 years for selected conditions, precautions do apply. Overdosage: If you think you have taken too much of this medicine contact a poison control center or emergency room at once. NOTE: This medicine is only for  you. Do not share this medicine with others. What if I miss a dose? If you miss a dose, use it as soon as you can. If it is almost time for your next dose, use only that dose. Do not use double or extra doses. What may interact with this medication? Interactions are not expected. Do not use any other skin products without telling your care team. This list may not describe all possible interactions. Give your health care provider a list of all the medicines, herbs, non-prescription drugs, or dietary supplements you use. Also tell them if you smoke, drink alcohol, or use illegal drugs. Some items may interact with your medicine. What should I watch for while using this medication? Visit your care team for regular checks on your progress. It may be some time before you see the benefit from this medication. Do not use nail polish or other nail cosmetic products on the treated nails. Removal of the unattached, infected nail by your care team is needed with use of this medication. If you have diabetes or numbness in your fingers or toes, talk to your care team about proper nail care. What side effects may I notice from receiving this medication? Side effects that you should report to your care team as soon as possible: Allergic reactions--skin rash, itching, hives, swelling of the face, lips, tongue, or throat Burning, itching, crusting, or peeling of treated skin Side effects that usually do not require medical attention (report to your care team if they continue or are bothersome): Change in nail shape,  thickness, or color Mild skin irritation, redness, or dryness This list may not describe all possible side effects. Call your doctor for medical advice about side effects. You may report side effects to FDA at 1-800-FDA-1088. Where should I keep my medication? Keep out of the reach of children and pets. Store at room temperature between 20 and 25 degrees C (68 and 77 degrees F). This medication is  flammable. Avoid exposure to heat, fire, flame, and smoking. Get rid of medications that are no longer needed or have expired: Take the medication to a medication take-back program. Check with your pharmacy or law enforcement to find a location. If you cannot return the medication, check the label or package insert to see if the medication should be thrown out in the garbage or flushed down the toilet. If you are not sure, ask your care team. If it is safe to put in the trash, take the medication out of the container. Mix the medication with cat litter, dirt, coffee grounds, or other unwanted substance. Seal the mixture in a bag or container. Put it in the trash. NOTE: This sheet is a summary. It may not cover all possible information. If you have questions about this medicine, talk to your doctor, pharmacist, or health care provider.  2024 Elsevier/Gold Standard (2021-05-30 00:00:00)

## 2023-07-05 NOTE — Progress Notes (Signed)
  Subjective:  Patient ID: Lauren Cain, female    DOB: 25-Jun-2000,  MRN: [id number]  Chief Complaint  Patient presents with   Nail Problem    RM 13 Bilateral nail discoloration. Pt states she has flat feet, is interested in orthotics.    Discussed the use of AI scribe software for clinical note transcription with the patient, who gave verbal consent to proceed.  History of Present Illness [name] is a 23 year old female who presents with toenail deformities and orthotic needs.  She has experienced toenail deformities since 2022, following surgeries in [place] during her early years. 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 She has difficulty wearing shoes and pain in her big toe. There is no drainage or signs of infection.  She has flat feet, causing her to wear out her shoes. She previously received orthotics from [facility] but requires new orthotics due to wear over the past four years. There is no pain associated with her flat feet.      Objective:    Physical Exam General: AAO x3, NAD  Dermatological: Positive hypertrophic, dystrophic with yellow, brown discoloration with some low-grade present.  There is no edema, erythema or signs of infection.  No open lesions.  Vascular: Dorsalis Pedis artery and Posterior Tibial artery pedal pulses are 2/4 bilateral with immedate capillary fill time.  There is no pain with calf compression, swelling, warmth, erythema.   Neruologic: Grossly intact via light touch bilateral.   Musculoskeletal: Decreased medial arch upon weightbearing.  Right able to appreciate any area pinpoint tenderness there is no pain associated with the flatfeet.  Ankle, subtalar joint range of motion intact.  Gait: Unassisted, Nonantalgic.     No images are attached to the encounter.    Results    Assessment:   1. Onychomycosis   2.  Bilateral pes planus      Plan:  Patient was evaluated and treated and all questions answered.  Assessment and Plan Assessment & Plan Onychomycosis Chronic toenail deformity and pain likely due to onychomycosis.  I reviewed prior culture.  Opted for topical treatment to avoid systemic side effects. - Prescribed ciclopirox (Penlac) topical antifungal for daily application. - Instructed to clean nails with isopropyl alcohol weekly. - Advised against nail polish. - Recommended regular nail trimmings and waterless nail spa visits. - Suggested tea tree oil or coconut oil for hydration.  Flat feet Chronic flat feet with no current pain. Referral to [facility] for orthotics due to insurance coverage limitations. - Provided prescription for orthotics to be filled at [facility].   Return in about 3 months (around 10/03/2023).   Charity Conch DPM

## 2023-07-16 ENCOUNTER — Telehealth: Payer: Self-pay

## 2023-07-16 NOTE — Telephone Encounter (Signed)
 Copied from CRM [id number]. Topic: Clinical - Lab/Test Results >> Jul 16, 2023 10:00 AM Taleah C wrote: Reason for CRM: patient's mother called back for lab results. I relayed the message that Alyssa sent on Mychart. She verbalized understanding.  Additionally, Her mom explained that she doesn't have access to her daughter's MyChart and she needs it because the patient is disabled. I informed Wallene Gum that she can either google the Proxy Form or come to the office to fill out information that will be sent to medical records for review to be granted access.  Phone note sent as FYI; nothing needed at this time

## 2023-07-30 ENCOUNTER — Ambulatory Visit: Payer: Self-pay | Admitting: Internal Medicine

## 2023-07-30 DIAGNOSIS — I340 Nonrheumatic mitral (valve) insufficiency: Secondary | ICD-10-CM | POA: Diagnosis not present

## 2023-07-30 DIAGNOSIS — R002 Palpitations: Secondary | ICD-10-CM | POA: Diagnosis not present

## 2023-09-26 ENCOUNTER — Ambulatory Visit: Payer: MEDICAID | Admitting: Allergy

## 2023-10-29 ENCOUNTER — Other Ambulatory Visit: Payer: Self-pay | Admitting: *Deleted

## 2023-10-29 MED ORDER — IPRATROPIUM BROMIDE 0.06 % NA SOLN: NASAL | 5 refills | Status: AC

## 2023-11-13 ENCOUNTER — Ambulatory Visit: Payer: MEDICAID | Admitting: Nurse Practitioner

## 2023-11-28 ENCOUNTER — Ambulatory Visit: Payer: MEDICAID | Admitting: Allergy

## 2023-11-29 ENCOUNTER — Other Ambulatory Visit: Payer: Self-pay | Admitting: Physician Assistant

## 2023-12-04 ENCOUNTER — Telehealth: Payer: MEDICAID | Admitting: Nurse Practitioner

## 2023-12-24 ENCOUNTER — Ambulatory Visit: Payer: Self-pay | Admitting: Physician Assistant

## 2023-12-24 ENCOUNTER — Encounter: Payer: Self-pay | Admitting: Physician Assistant

## 2023-12-24 ENCOUNTER — Ambulatory Visit: Payer: MEDICAID | Admitting: Physician Assistant

## 2023-12-24 VITALS — BP 106/74 | HR 79 | Temp 98.2°F | Ht 63.0 in | Wt 145.4 lb

## 2023-12-24 DIAGNOSIS — R300 Dysuria: Secondary | ICD-10-CM | POA: Diagnosis not present

## 2023-12-24 DIAGNOSIS — Z23 Encounter for immunization: Secondary | ICD-10-CM | POA: Diagnosis not present

## 2023-12-24 DIAGNOSIS — L309 Dermatitis, unspecified: Secondary | ICD-10-CM

## 2023-12-24 DIAGNOSIS — R1024 Suprapubic pain: Secondary | ICD-10-CM

## 2023-12-24 DIAGNOSIS — N921 Excessive and frequent menstruation with irregular cycle: Secondary | ICD-10-CM | POA: Diagnosis not present

## 2023-12-24 DIAGNOSIS — Z3041 Encounter for surveillance of contraceptive pills: Secondary | ICD-10-CM | POA: Diagnosis not present

## 2023-12-24 DIAGNOSIS — Z975 Presence of (intrauterine) contraceptive device: Secondary | ICD-10-CM

## 2023-12-24 LAB — POC URINALSYSI DIPSTICK (AUTOMATED)
Bilirubin, UA: NEGATIVE
Blood, UA: NEGATIVE
Glucose, UA: NEGATIVE
Ketones, UA: NEGATIVE
Leukocytes, UA: NEGATIVE
Nitrite, UA: NEGATIVE
Protein, UA: NEGATIVE
Spec Grav, UA: 1.01 (ref 1.010–1.025)
Urobilinogen, UA: 0.2 U/dL
pH, UA: 6.5 (ref 5.0–8.0)

## 2023-12-24 NOTE — Progress Notes (Addendum)
 Patient ID: Lauren Cain, female    DOB: [birth year], 23 y.o.   MRN: [id number]   Assessment & Plan:  Oral contraceptive use -     Ambulatory referral to Gynecology  Breakthrough bleeding on Nexplanon  -     Ambulatory referral to Gynecology  Immunization due -     Flu vaccine trivalent PF, 6mos and older(Flulaval,Afluria,Fluarix,Fluzone) -     Tdap vaccine greater than or equal to 7yo IM  Eczema, unspecified type -     Ambulatory referral to Dermatology  Suprapubic pain -     POCT Urinalysis Dipstick (Automated)  Dysuria -     POCT Urinalysis Dipstick (Automated)      Assessment and Plan Assessment & Plan  Excessive and frequent menstruation with irregular cycle Irregular menstrual cycles with excessive bleeding. Previously on Depo-Provera , now switched to norethindrone . Per pt she has Nexplanon  insert, placed in Feb 2023.  - Referred to gynecology for management of menstrual irregularities - Refilled the norethindrone  until she can f/up with GYN   Periorbital eczema Dark circles and itching around the eyes, possibly due to eczema. Previous dermatology visits for similar issues. Current use of Aquaphor for moisturizing. - Referred to dermatology for evaluation and management of periorbital eczema. - Continue using Aquaphor for moisturizing.  Suprapubic pain and dysuria Reports suprapubic pain and dysuria for a couple of days. No recent sexual activity. Possible urinary tract infection considered. - Ordered urinalysis to evaluate for urinary tract infection.  General Health Maintenance Due for flu and tetanus vaccinations. Pneumonia vaccine recommended due to asthma, but insurance only covers flu and tetanus at this location. - Administered flu and tetanus vaccines. - Advised to obtain pneumonia vaccine at a pharmacy or health department.   F/up prn    Subjective:    Chief Complaint  Patient presents with   Medical Management of Chronic Issues    Pt in  office with c/o under eyes getting dark again and also requesting flu vaccine; wondering if any labs need to be done; also pt c/o left side pain and notices more after eating;     HPI Discussed the use of AI scribe software for clinical note transcription with the patient, who gave verbal consent to proceed.  History of Present Illness Lauren Cain is a 23 year old female who presents with eye irritation and suprapubic pain. Here with mother.   She experiences eye irritation characterized by itching and dark circles around her eyes. She frequently plucks at her eyes, which her mother attributes to a possible increase in medication. She denies wearing makeup or using face creams, but applies Aquaphor and Vaseline around her eyes. She has a history of eczema. Her last eye examination was in March, and her glasses are up to date.  She has been experiencing suprapubic pain for a couple of days, described as a pinching sensation in the front. She sometimes experiences burning during urination. She mentions she might have run into something.  She recently started taking Sprintec a week ago. She has a history of using methadone for menstrual cycle management, and there is a concern about the expiration of the methadone implant. Her vitamin D  and C levels were noted to be low, possibly related to her previous Depo shot.  She is currently working with psychiatry and is in the process of arranging for someone to conduct an assessment at her home. Her mother mentions that she has been trying to get psychiatric support for a long time.  No significant mood changes or feelings of fear. Reports itching around her eyes and sometimes burning during urination.     Past Medical History:  Diagnosis Date   Abnormal weight loss    Asthma    Dysmenorrhea    Eczema    Mental impairment    Mitral valve prolapse    URI (upper respiratory infection)     Past Surgical History:  Procedure Laterality Date    MOUTH SURGERY     x 2   TEAR DUCT PROBING Bilateral    TOENAIL EXCISION     removal    Family History  Problem Relation Age of Onset   Asthma Mother    Allergic rhinitis Mother    Obesity Mother    Allergic rhinitis Father    Prostate cancer Maternal Grandfather    Kidney disease Maternal Grandfather    Atrial fibrillation Maternal Grandfather     Social History   Tobacco Use   Smoking status: Never   Smokeless tobacco: Never  Vaping Use   Vaping status: Never Used  Substance Use Topics   Alcohol use: Not Currently   Drug use: Never     Allergies  Allergen Reactions   Shellfish Protein-Containing Drug Products Swelling   Cats Claw [Uncaria Tomentosa (Cats Claw)]    Dust Mite Extract    Other     TREES   Shellfish Allergy    Tree Extract     Review of Systems NEGATIVE UNLESS OTHERWISE INDICATED IN HPI      Objective:     BP 106/74 (BP Location: Left Arm, Patient Position: Sitting, Cuff Size: Normal)   Pulse 79   Temp 98.2 F (36.8 C) (Temporal)   Ht 5' 3 (1.6 m)   Wt 145 lb 6.4 oz (66 kg)   LMP 12/17/2023 (Exact Date)   SpO2 97%   BMI 25.76 kg/m   Wt Readings from Last 3 Encounters:  12/24/23 145 lb 6.4 oz (66 kg)  06/25/23 141 lb 3.2 oz (64 kg)  06/22/23 141 lb 9.6 oz (64.2 kg)    BP Readings from Last 3 Encounters:  12/24/23 106/74  06/25/23 106/68  06/22/23 118/78     Physical Exam Vitals and nursing note reviewed.  Constitutional:      General: She is not in acute distress.    Appearance: Normal appearance. She is not ill-appearing.  HENT:     Head: Normocephalic and atraumatic.  Cardiovascular:     Rate and Rhythm: Normal rate and regular rhythm.     Pulses: Normal pulses.     Heart sounds: Normal heart sounds.  Pulmonary:     Effort: Pulmonary effort is normal.     Breath sounds: Normal breath sounds.  Abdominal:     General: Abdomen is flat. Bowel sounds are normal.     Palpations: Abdomen is soft. There is no mass.      Tenderness: There is abdominal tenderness (slight discomfort suprapubic). There is no right CVA tenderness or left CVA tenderness.     Hernia: No hernia is present.  Skin:    General: Skin is warm and dry.     Findings: Rash (hyperpigmentation noted surrounding eyes) present.  Neurological:     General: No focal deficit present.     Mental Status: She is alert.  Psychiatric:        Mood and Affect: Mood normal.        Leonetta Mcgivern M Nicki Gracy, PA-C

## 2023-12-28 ENCOUNTER — Other Ambulatory Visit: Payer: Self-pay | Admitting: Physician Assistant

## 2023-12-28 DIAGNOSIS — Z975 Presence of (intrauterine) contraceptive device: Secondary | ICD-10-CM

## 2024-02-25 ENCOUNTER — Encounter: Payer: MEDICAID | Admitting: Obstetrics and Gynecology

## 2024-02-28 ENCOUNTER — Telehealth: Payer: MEDICAID | Admitting: Nurse Practitioner

## 2024-02-28 DIAGNOSIS — F419 Anxiety disorder, unspecified: Secondary | ICD-10-CM | POA: Diagnosis not present

## 2024-02-28 DIAGNOSIS — F331 Major depressive disorder, recurrent, moderate: Secondary | ICD-10-CM | POA: Insufficient documentation

## 2024-02-28 DIAGNOSIS — F39 Unspecified mood [affective] disorder: Secondary | ICD-10-CM

## 2024-02-28 DIAGNOSIS — F79 Unspecified intellectual disabilities: Secondary | ICD-10-CM | POA: Diagnosis not present

## 2024-02-28 NOTE — Progress Notes (Signed)
 "  Subjective:  She's not been sleeping lately.    HPI: Lauren Cain is a 24 y.o. female presenting on 02/28/2024 for medication follow up in presence of her mother.  Mom reports patient is doing well on her medications but has been having trouble with sleep lately. She reports patient has been doing well on her Risperidone  with decreased agitation, aggression, irritability and remains less impulsive.. She reports improvement with decreased anxiety, depressive symptoms, apprehensions and mood swings.  She denies poor appetite, psychosis, delusions, suicidal or homicidal ideations.    ROS: Negative unless specifically indicated above in HPI.   Relevant past medical history reviewed and updated as indicated.   Allergies and medications reviewed and updated.   Current Outpatient Medications  Medication Instructions   albuterol  (PROVENTIL ) (2.5 MG/3ML) 0.083% nebulizer solution USE 1 VIAL IN NEBULIZER EVERY 4 HOURS AS NEEDED FOR WHEEZING/SHORTNESS OF BREATH   albuterol  (VENTOLIN  HFA) 108 (90 Base) MCG/ACT inhaler 2 puffs, Inhalation, Every 6 hours PRN   arformoterol  (BROVANA ) 15 mcg, Nebulization, 2 times daily   Ascorbic Acid (VITAMIN C PO) 500 mg, Daily   budesonide  (PULMICORT ) 0.5 mg, Nebulization, 2 times daily, Mix with Formoterol  in nebulizer   Calcium  Carbonate Antacid 648 mg, Oral, Daily   ciclopirox  (PENLAC ) 8 % solution Topical, Daily at bedtime, Apply over nail and surrounding skin. Apply daily over previous coat. After seven (7) days, may remove with alcohol and continue cycle.   cromolyn  (OPTICROM ) 4 % ophthalmic solution 2 drops, Both Eyes, 4 times daily PRN   diphenhydrAMINE (BENADRYL) 12.5 MG/5ML elixir TAKE ONE TIME AS NEEDED FOR UP TO 1 DOSE FOR ALLERGIES CAN GIVE 2ND DOSE AFTER 15 MINUTES)   EPINEPHrine  (EPI-PEN) 0.3 mg, Intramuscular, As needed   Etonogestrel  (NEXPLANON  [place]) Inject into the skin.   ipratropium (ATROVENT ) 0.06 % nasal spray 1-2 sprays each  nostril 1-2 times a day as needed for nasal congestion or drainage   ketoconazole  (NIZORAL ) 2 % shampoo 1 Application 1-2 times a week   levocetirizine (XYZAL ) 5 mg, Oral, Every evening   mometasone  (NASONEX ) 50 MCG/ACT nasal spray 2 sprays, Nasal, Daily   montelukast  (SINGULAIR ) 10 mg, Oral, Daily at bedtime   Multiple Vitamin (MULTIVITAMIN ADULT) TABS 1 tablet, Oral, Daily   Multiple Vitamins-Minerals (MULTIVITAMIN WITH MINERALS) tablet 1 tablet, Daily   norethindrone  (AYGESTIN ) 5 mg, Oral, Daily   Respiratory Therapy Supplies (NEBULIZER MASK ADULT) MISC 1 each, Nebulization, As directed   risperiDONE  (RISPERDAL ) 1 MG tablet TAKE 1 TABLET EVERY DAY   sertraline (ZOLOFT) 50 mg, Daily at bedtime   Spacer/Aero Chamber Mouthpiece MISC Use as directed.   Vitamin D  2,000 Units, Oral, Daily     Allergies[1]  Objective:   There were no vitals taken for this visit.   Physical Exam Constitutional:      Appearance: Normal appearance.  HENT:     Head: Normocephalic.  Eyes:     Conjunctiva/sclera: Conjunctivae normal.  Skin:    General: Skin is warm and dry.  Neurological:     General: No focal deficit present.     Mental Status: She is alert and oriented to person, place, and time.  Psychiatric:        Mood and Affect: Mood normal.        Behavior: Behavior normal.        Thought Content: Thought content normal.        Judgment: Judgment normal.    Assessment & Plan:   Assessment & Plan  Moderate episode of recurrent major depressive disorder (HCC)     Anxiety     Intellectual disability     Mood disorder     Continue Risperidone  1 mg po at bedtime for agression/agiataion Continue Sertraline 100 mg po daiy for depression/anxiety Start Hydroxyzine 25-50 mg po at bedtime prn anxiety/sleep   Follow up plan: Return in about 4 weeks (around 03/27/2024).  Florencia Cousin, NP      [1]  Allergies Allergen Reactions   Shellfish Protein-Containing Drug Products Swelling    Cats Claw [Uncaria Tomentosa (Cats Claw)]    Dust Mite Extract    Other     TREES   Shellfish Allergy    Tree Extract    "

## 2024-02-28 NOTE — Assessment & Plan Note (Signed)
 SABRA

## 2024-02-28 NOTE — Assessment & Plan Note (Signed)
 Lauren Cain

## 2024-03-06 ENCOUNTER — Ambulatory Visit: Payer: MEDICAID | Admitting: Allergy

## 2024-03-27 ENCOUNTER — Telehealth: Payer: MEDICAID | Admitting: Nurse Practitioner

## 2024-03-28 ENCOUNTER — Ambulatory Visit: Payer: MEDICAID | Admitting: Physician Assistant

## 2024-04-23 ENCOUNTER — Ambulatory Visit: Payer: MEDICAID | Admitting: Allergy

## 2024-08-05 ENCOUNTER — Ambulatory Visit: Payer: MEDICAID | Admitting: Dermatology
